# Patient Record
Sex: Female | Born: 1945 | Race: White | Hispanic: No | Smoking: Never smoker
Health system: Southern US, Community
[De-identification: ages and names within clinical notes are randomized; demographics above are authoritative.]

## PROBLEM LIST (undated history)

## (undated) DIAGNOSIS — K921 Melena: Secondary | ICD-10-CM

## (undated) DIAGNOSIS — I10 Essential (primary) hypertension: Secondary | ICD-10-CM

## (undated) DIAGNOSIS — R87615 Unsatisfactory cytologic smear of cervix: Secondary | ICD-10-CM

## (undated) DIAGNOSIS — R195 Other fecal abnormalities: Secondary | ICD-10-CM

## (undated) HISTORY — DX: Melena: K92.1

## (undated) HISTORY — DX: Other fecal abnormalities: R19.5

## (undated) HISTORY — PX: TUBAL LIGATION: SHX77

## (undated) HISTORY — PX: EYE SURGERY: SHX253

## (undated) HISTORY — PX: ABDOMINAL HYSTERECTOMY: SHX81

## (undated) HISTORY — DX: Essential (primary) hypertension: I10

## (undated) HISTORY — PX: COSMETIC SURGERY: SHX468

## (undated) HISTORY — PX: HERNIA REPAIR: SHX51

## (undated) HISTORY — DX: Unsatisfactory cytologic smear of cervix: R87.615

---

## 2005-10-11 ENCOUNTER — Ambulatory Visit: Payer: Self-pay | Admitting: Internal Medicine

## 2010-11-01 ENCOUNTER — Encounter: Payer: Self-pay | Admitting: Gastroenterology

## 2010-11-08 ENCOUNTER — Encounter (INDEPENDENT_AMBULATORY_CARE_PROVIDER_SITE_OTHER): Payer: Self-pay | Admitting: *Deleted

## 2010-12-27 NOTE — Letter (Signed)
Summary: New Patient letter  [facility]  [address]   [place], [place] [address]   Phone: [phone]  Fax: [phone]       11/08/2010 MRN: [id number]  [facility] [address] [place], [place]  [address]  Dear Ms. Mcneal,  Welcome to the Gastroenterology Division at [facility].    You are scheduled to see Dr.  Jarold Motto on 12/18/2010 at 8:30 am on the 3rd floor at [facility], [address]  We ask that you try to arrive at our office 15 minutes prior to your appointment time to allow for check-in.  We would like you to complete the enclosed self-administered evaluation form prior to your visit and bring it with you on the day of your appointment.  We will review it with you.  Also, please bring a complete list of all your medications or, if you prefer, bring the medication bottles and we will list them.  Please bring your insurance card so that we may make a copy of it.  If your insurance requires a referral to see a specialist, please bring your referral form from your primary care physician.  Co-payments are due at the time of your visit and may be paid by cash, check or credit card.     Your office visit will consist of a consult with your physician (includes a physical exam), any laboratory testing he/she may order, scheduling of any necessary diagnostic testing (e.g. x-ray, ultrasound, CT-scan), and scheduling of a procedure (e.g. Endoscopy, Colonoscopy) if required.  Please allow enough time on your schedule to allow for any/all of these possibilities.    If you cannot keep your appointment, please call [phone] to cancel or reschedule prior to your appointment date.  This allows [place] the opportunity to schedule an appointment for another patient in need of care.  If you do not cancel or reschedule by 5 p.m. the business day prior to your appointment date, you will be charged a $50.00 late cancellation/no-show fee.    Thank you  for choosing [facility] for your medical needs.  We appreciate the opportunity to care for you.  Please visit [place] at our website  to learn more about our practice.                     Sincerely,                                                             The Gastroenterology Division

## 2010-12-31 DIAGNOSIS — K921 Melena: Secondary | ICD-10-CM | POA: Insufficient documentation

## 2010-12-31 DIAGNOSIS — I10 Essential (primary) hypertension: Secondary | ICD-10-CM | POA: Insufficient documentation

## 2011-01-01 ENCOUNTER — Encounter (INDEPENDENT_AMBULATORY_CARE_PROVIDER_SITE_OTHER): Payer: Self-pay | Admitting: *Deleted

## 2011-01-01 ENCOUNTER — Encounter: Payer: Self-pay | Admitting: Gastroenterology

## 2011-01-01 ENCOUNTER — Other Ambulatory Visit: Payer: Self-pay | Admitting: Gastroenterology

## 2011-01-01 ENCOUNTER — Ambulatory Visit (INDEPENDENT_AMBULATORY_CARE_PROVIDER_SITE_OTHER): Payer: Medicare Other | Admitting: Gastroenterology

## 2011-01-01 ENCOUNTER — Other Ambulatory Visit: Payer: Medicare Other

## 2011-01-01 DIAGNOSIS — R87615 Unsatisfactory cytologic smear of cervix: Secondary | ICD-10-CM | POA: Insufficient documentation

## 2011-01-01 DIAGNOSIS — R195 Other fecal abnormalities: Secondary | ICD-10-CM

## 2011-01-01 DIAGNOSIS — K921 Melena: Secondary | ICD-10-CM

## 2011-01-01 LAB — CBC WITH DIFFERENTIAL/PLATELET
Basophils Relative: 0.6 % (ref 0.0–3.0)
Eosinophils Relative: 2.2 % (ref 0.0–5.0)
Hemoglobin: 13.7 g/dL (ref 12.0–15.0)
Lymphocytes Relative: 30 % (ref 12.0–46.0)
MCHC: 34.3 g/dL (ref 30.0–36.0)
Monocytes Relative: 7.6 % (ref 3.0–12.0)
Neutro Abs: 3.6 10*3/uL (ref 1.4–7.7)
RBC: 4.35 Mil/uL (ref 3.87–5.11)
WBC: 6 10*3/uL (ref 4.5–10.5)

## 2011-01-01 LAB — IBC PANEL
Iron: 107 ug/dL (ref 42–145)
Saturation Ratios: 27.9 % (ref 20.0–50.0)
Transferrin: 274.3 mg/dL (ref 212.0–360.0)

## 2011-01-01 LAB — FOLATE: Folate: 20.1 ng/mL (ref >5.9)

## 2011-01-07 ENCOUNTER — Other Ambulatory Visit: Payer: Self-pay | Admitting: Gastroenterology

## 2011-01-07 ENCOUNTER — Other Ambulatory Visit (AMBULATORY_SURGERY_CENTER): Payer: Medicare Other | Admitting: Gastroenterology

## 2011-01-07 DIAGNOSIS — K921 Melena: Secondary | ICD-10-CM

## 2011-01-07 DIAGNOSIS — K649 Unspecified hemorrhoids: Secondary | ICD-10-CM

## 2011-01-07 DIAGNOSIS — D126 Benign neoplasm of colon, unspecified: Secondary | ICD-10-CM

## 2011-01-07 DIAGNOSIS — R195 Other fecal abnormalities: Secondary | ICD-10-CM

## 2011-01-10 NOTE — Letter (Signed)
Summary: [facility] Instructions  [facility]  [address] [place], [place] [address]   Phone: [phone]  Fax: [phone]       Carmen Fernandez    12-03-1945    MRN: [id number]        Procedure Day /Date: Monday 01/07/2011     Arrival Time: 10:30am     Procedure Time: 11:30am     Location of Procedure:                    X  [facility] (4th Floor)                       PREPARATION FOR COLONOSCOPY WITH MOVIPREP   Starting 5 days prior to your procedure  01/02/2011 do not eat nuts, seeds, popcorn, corn, beans, peas,  salads, or any raw vegetables.  Do not take any fiber supplements (e.g. Metamucil, Citrucel, and Benefiber).  THE DAY BEFORE YOUR PROCEDURE         Sunday 01/06/2011  1.  Drink clear liquids the entire day-NO SOLID FOOD  2.  Do not drink anything colored red or purple.  Avoid juices with pulp.  No orange juice.  3.  Drink at least 64 oz. (8 glasses) of fluid/clear liquids during the day to prevent dehydration and help the prep work efficiently.  CLEAR LIQUIDS INCLUDE: Water Jello Ice Popsicles Tea (sugar ok, no milk/cream) Powdered fruit flavored drinks Coffee (sugar ok, no milk/cream) Gatorade Juice: apple, white grape, white cranberry  Lemonade Clear bullion, consomm, broth Carbonated beverages (any kind) Strained chicken noodle soup Hard Candy                             4.  In the morning, mix first dose of MoviPrep solution:    Empty 1 Pouch A and 1 Pouch B into the disposable container    Add lukewarm drinking water to the top line of the container. Mix to dissolve    Refrigerate (mixed solution should be used within 24 hrs)  5.  Begin drinking the prep at 5:00 p.m. The MoviPrep container is divided by 4 marks.   Every 15 minutes drink the solution down to the next mark (approximately 8 oz) until the full liter is complete.   6.  Follow completed prep with 16 oz of clear liquid of your choice (Nothing red or  purple).  Continue to drink clear liquids until bedtime.  7.  Before going to bed, mix second dose of MoviPrep solution:    Empty 1 Pouch A and 1 Pouch B into the disposable container    Add lukewarm drinking water to the top line of the container. Mix to dissolve    Refrigerate  THE DAY OF YOUR PROCEDURE      Monday 01/07/2011  Beginning at 6:30am (5 hours before procedure):         1. Every 15 minutes, drink the solution down to the next mark (approx 8 oz) until the full liter is complete.  2. Follow completed prep with 16 oz. of clear liquid of your choice.    3. You may drink clear liquids until 9:30am (2 HOURS BEFORE PROCEDURE).   MEDICATION INSTRUCTIONS  Unless otherwise instructed, you should take regular prescription medications with a small sip of water   as early as possible the morning of your procedure.  OTHER INSTRUCTIONS  You will need a responsible adult at least 66 years of age to accompany you and drive you home.   This person must remain in the waiting room during your procedure.  Wear loose fitting clothing that is easily removed.  Leave jewelry and other valuables at home.  However, you may wish to bring a book to read or  an iPod/MP3 player to listen to music as you wait for your procedure to start.  Remove all body piercing jewelry and leave at home.  Total time from sign-in until discharge is approximately 2-3 hours.  You should go home directly after your procedure and rest.  You can resume normal activities the  day after your procedure.  The day of your procedure you should not:   Drive   Make legal decisions   Operate machinery   Drink alcohol   Return to work  You will receive specific instructions about eating, activities and medications before you leave.    The above instructions have been reviewed and explained to me by   _______________________    I fully understand and can verbalize these instructions  _____________________________ Date _________

## 2011-01-10 NOTE — Assessment & Plan Note (Signed)
Summary: BLOOD IN STOOL...JJ   SCH'D W/PT//MEDICARE//BCBS//NO GI HX//M.Marland KitchenMarland Kitchen   History of Present Illness Visit Type: Initial Consult Primary GI MD: Sheryn Bison MD FACP FAGA Primary Provider: Robert Bellow, MD Requesting Provider: Robert Bellow, MD Chief Complaint: Blood in stool x 1 month History of Present Illness:   Extremely Pleasant 65 year old Caucasian female referred through the courtesy of Dr. Thayer Ohm Guest per recent guaiac positive stool at the time of her physical exam.The Patient denies any GI complaints except for chronic constipation alleviated with recent increased  fiber in her diet. She specifically denies melena, hematochezia, abdominal pain, rectal pain, any upper gastrointestinal or hepatobiliary complaints. She has not had previous barium studies or endoscopic or colonoscopy exams. There is no history of NSAID or salicylate use, anorexia, weight loss, or other systemic complaints. Recent Pap smear did show an abnormal Pap smear, and she is referred to Dr. Arlyce Dice for gynecologic exam.Family History is entirely negative except for her father who was a heavy smoker and died of lung cancer.   GI Review of Systems      Denies abdominal pain, acid reflux, belching, bloating, chest pain, dysphagia with liquids, dysphagia with solids, heartburn, loss of appetite, nausea, vomiting, vomiting blood, weight loss, and  weight gain.      Reports heme positive stool and  hemorrhoids.     Denies anal fissure, black tarry stools, change in bowel habit, constipation, diarrhea, diverticulosis, fecal incontinence, irritable bowel syndrome, jaundice, light color stool, liver problems, rectal bleeding, and  rectal pain.  Preventive Screening-Counseling & Management  Alcohol-Tobacco     Smoking Status: never      Drug Use:  no.      Current Medications (verified): 1)  None  Allergies (verified): 1)  ! Pcn  Past History:  Past medical, surgical, family and social histories (including  risk factors) reviewed for relevance to current acute and chronic problems.  Past Medical History: Reviewed history from 12/31/2010 and no changes required. Current Problems:  HYPERTENSION (ICD-401.9) HEMOCCULT POSITIVE STOOL (ICD-578.1)    Past Surgical History: Reviewed history from 12/31/2010 and no changes required. Left Inguinal Hernia Repair Hysterectomy  Family History: Reviewed history from 12/31/2010 and no changes required. Family History of Heart Disease: Mother  Social History: Reviewed history from 12/31/2010 and no changes required. Illicit Drug Use - no Occupation:  Patient has never smoked.  Alcohol Use - yes Daily Caffeine Use Smoking Status:  never  Review of Systems  The patient denies allergy/sinus, anemia, anxiety-new, arthritis/joint pain, back pain, blood in urine, breast changes/lumps, change in vision, confusion, cough, coughing up blood, depression-new, fainting, fatigue, fever, headaches-new, hearing problems, heart murmur, heart rhythm changes, itching, menstrual pain, muscle pains/cramps, night sweats, nosebleeds, pregnancy symptoms, shortness of breath, skin rash, sleeping problems, sore throat, swelling of feet/legs, swollen lymph glands, thirst - excessive , urination - excessive , urination changes/pain, urine leakage, vision changes, and voice change.    Vital Signs:  Patient profile:   65 year old female Height:      66 inches Weight:      145.13 pounds BMI:     23.51 Pulse rate:   60 / minute Pulse rhythm:   regular BP sitting:   122 / 80  (left arm) Cuff size:   regular  Vitals Entered By: June McMurray CMA Duncan Dull) (January 01, 2011 9:39 AM)  Physical Exam  General:  Well developed, well nourished, no acute distress.healthy appearing.   Head:  Normocephalic and atraumatic. Eyes:  PERRLA,  no icterus.exam deferred to patient's ophthalmologist.   Neck:  Supple; no masses or thyromegaly. Lungs:  Clear throughout to  auscultation. Heart:  Regular rate and rhythm; no murmurs, rubs,  or bruits. Abdomen:  Soft, nontender and nondistended. No masses, hepatosplenomegaly or hernias noted. Normal bowel sounds. Rectal:  deferred until time of colonoscopy.   Msk:  Symmetrical with no gross deformities. Normal posture. Pulses:  Normal pulses noted. Extremities:  No clubbing, cyanosis, edema or deformities noted. Neurologic:  Alert and  oriented x4;  grossly normal neurologically. Cervical Nodes:  No significant cervical adenopathy. Psych:  Alert and cooperative. Normal mood and affect.   Impression & Recommendations:  Problem # 1:  FECAL OCCULT BLOOD (ICD-792.1) Assessment Unchanged Colonoscopy scheduled at her convenience to exclude underlying colonic polyposis. I did order repeat CBC and iron studies. The preparation, risks, and benefits of colonoscopy were explained in detail, and she has agreed to proceed as planned. Her chronic constipation is improved with increased fiber in her diet. Orders: Colonoscopy (Colon) TLB-CBC Platelet - w/Differential (85025-CBCD) TLB-B12, Serum-Total ONLY (60454-U98) TLB-Ferritin (82728-FER) TLB-Folic Acid (Folate) (82746-FOL) TLB-IBC Pnl (Iron/FE;Transferrin) (83550-IBC)  Problem # 2:  HYPERTENSION (ICD-401.9) Assessment: Unchanged blood pressure today 122/80 and she is not on any medications.  Problem # 3:  NONSPEC ABNORM PAP CERV UNSATISFACTORY CYTOLOGY (ICD-795.08) Assessment: Unchanged Gynecologic referral in progress.  Patient Instructions: 1)  Copy sent to : Robert Bellow, MD 2)  Your prescription(s) have been sent to you pharmacy.  3)  Please go to the basement today for your labs.  4)  Your procedure has been scheduled for 01/07/2011, please follow the seperate instructions.  5)  [facility] Patient Information Guide given to patient.  6)  Colonoscopy and Flexible Sigmoidoscopy brochure given.  7)  The medication list was reviewed and reconciled.   All changed / newly prescribed medications were explained.  A complete medication list was provided to the patient / caregiver. Prescriptions: MOVIPREP 100 GM  SOLR (PEG-KCL-NACL-NASULF-NA ASC-C) As per prep instructions.  #1 x 0   Entered by:   Harlow Mares CMA (AAMA)   Authorized by:   Mardella Layman MD [facility]   Signed by:   Mardella Layman MD [facility] on 01/01/2011   Method used:   Electronically to        [facility] [address] [phone]* (retail)       [address]       [place], [place]  [address]       Ph: [phone] or [phone]       Fax: [phone]   RxID:   [id number]

## 2011-01-11 ENCOUNTER — Encounter: Payer: Self-pay | Admitting: Gastroenterology

## 2011-01-16 NOTE — Procedures (Addendum)
Summary: Colonoscopy  Patient: Carmen Fernandez Note: All result statuses are Final unless otherwise noted.  Tests: (1) Colonoscopy (COL)   COL Colonoscopy           DONE     [facility]     [address]     [place], [place]  [address]           COLONOSCOPY PROCEDURE REPORT           PATIENT:  Carmen, Fernandez  MR#:  [id number]     BIRTHDATE:  10/14/1946, 64 yrs. old  GENDER:  female     ENDOSCOPIST:  Vania Rea. Jarold Motto, MD, [facility]     REF. BY:  Robert Bellow, M.D.     PROCEDURE DATE:  01/07/2011     PROCEDURE:  Colonoscopy with snare polypectomy     ASA CLASS:  Class I     INDICATIONS:  FOBT positive stool     MEDICATIONS:   Fentanyl 87.5 IV, Versed 8 mg IV           DESCRIPTION OF PROCEDURE:   After the risks benefits and     alternatives of the procedure were thoroughly explained, informed     consent was obtained.  Digital rectal exam was performed and     revealed no abnormalities.   The LB [id number]-[id number] endoscope     was introduced through the anus and advanced to the cecum, which     was identified by both the appendix and ileocecal valve, without     limitations.  The quality of the prep was excellent, using     MoviPrep.  The instrument was then slowly withdrawn as the colon     was fully examined.     <<PROCEDUREIMAGES>>           FINDINGS:  ENDOSCOPIC FINDINGS:  A sessile polyp was found in the     cecum. CM. CECAL POLYP IN BASE OF CECUM HOT SNARE EXCISED.     Retroflexed views in the rectum revealed hemorrhoids.    The scope     was then withdrawn from the patient and the procedure completed.           COMPLICATIONS:  None     ENDOSCOPIC IMPRESSION:     1) Sessile polyp in the cecum     2) Hemorrhoids     R/O ADENOMA.     RECOMMENDATIONS:     1) Repeat colonoscopy in 5 years.     REPEAT EXAM:  No           ______________________________     Vania Rea. Jarold Motto, MD, [facility]           CC:           n.     eSIGNED:   Vania Rea. Patterson at 01/07/2011  11:37 AM           Darden Palmer, [id number]  Note: An exclamation mark (!) indicates a result that was not dispersed into the flowsheet. Document Creation Date: 01/07/2011 11:37 AM _______________________________________________________________________  (1) Order result status: Final Collection or observation date-time: 01/07/2011 11:27 Requested date-time:  Receipt date-time:  Reported date-time:  Referring Physician:   Ordering Physician: Sheryn Bison [phone]) Specimen Source:  Source: Launa Grill Order Number: [id number] Lab site:   Appended Document: Colonoscopy     Procedures Next Due Date:    Colonoscopy: 12/2015

## 2011-01-22 NOTE — Letter (Signed)
Summary: Patient Notice- Polyp Results  [facility]  [address] [place], [place] [address]   Phone: [phone]  Fax: [phone]        January 11, 2011 MRN: [id number]    [facility] [address] [place], [place]  [address]    Dear Ms. Wallman,  I am pleased to inform you that the colon polyp(s) removed during your recent colonoscopy was (were) found to be benign (no cancer detected) upon pathologic examination.  I recommend you have a repeat colonoscopy examination in 5_ years to look for recurrent polyps, as having colon polyps increases your risk for having recurrent polyps or even colon cancer in the future.  Should you develop new or worsening symptoms of abdominal pain, bowel habit changes or bleeding from the rectum or bowels, please schedule an evaluation with either your primary care physician or with me.  Additional information/recommendations:  _x_ No further action with gastroenterology is needed at this time. Please      follow-up with your primary care physician for your other healthcare      needs.  __ Please call [phone] to schedule a return visit to review your      situation.  __ Please keep your follow-up visit as already scheduled.  __ Continue treatment plan as outlined the day of your exam.  Please call us if you are having persistent problems or have questions about your condition that have not been fully answered at this time.  Sincerely,  Mardella Layman MD [facility]  This letter has been electronically signed by your physician.  Appended Document: Patient Notice- Polyp Results letter mailed

## 2012-04-08 ENCOUNTER — Ambulatory Visit (INDEPENDENT_AMBULATORY_CARE_PROVIDER_SITE_OTHER): Payer: Medicare Other | Admitting: Family Medicine

## 2012-04-08 VITALS — BP 142/83 | HR 64 | Temp 98.4°F | Resp 18 | Ht 65.25 in | Wt 152.0 lb

## 2012-04-08 DIAGNOSIS — I10 Essential (primary) hypertension: Secondary | ICD-10-CM

## 2012-04-08 DIAGNOSIS — Z0000 Encounter for general adult medical examination without abnormal findings: Secondary | ICD-10-CM

## 2012-04-08 DIAGNOSIS — R5383 Other fatigue: Secondary | ICD-10-CM

## 2012-04-08 LAB — COMPREHENSIVE METABOLIC PANEL
ALT: 28 U/L (ref 0–35)
AST: 20 U/L (ref 0–37)
Albumin: 4.7 g/dL (ref 3.5–5.2)
Calcium: 9.5 mg/dL (ref 8.4–10.5)
Chloride: 104 mEq/L (ref 96–112)
Creat: 0.75 mg/dL (ref 0.50–1.10)
Potassium: 4.8 mEq/L (ref 3.5–5.3)
Total Bilirubin: 0.4 mg/dL (ref 0.3–1.2)

## 2012-04-08 LAB — POCT CBC
Granulocyte percent: 60.9 %G (ref 37–80)
MCH, POC: 29.9 pg (ref 27–31.2)
MCHC: 32.6 g/dL (ref 31.8–35.4)
MID (cbc): 0.5 (ref 0–0.9)
MPV: 8.7 fL (ref 0–99.8)
POC MID %: 6.9 %M (ref 0–12)
Platelet Count, POC: 369 10*3/uL (ref 142–424)
RBC: 4.51 M/uL (ref 4.04–5.48)
WBC: 6.7 10*3/uL (ref 4.6–10.2)

## 2012-04-08 LAB — LIPID PANEL
HDL: 54 mg/dL (ref >39)
LDL Cholesterol: 166 mg/dL — ABNORMAL HIGH (ref 0–99)
Total CHOL/HDL Ratio: 4.7 Ratio

## 2012-04-08 NOTE — Progress Notes (Signed)
  Patient Name: Carmen Fernandez Date of Birth: 1945/12/05 Medical Record Number: [id number] Gender: female Date of Encounter: 04/08/2012  History of Present Illness:  Carmen Fernandez is a 66 y.o. very pleasant female patient who presents with the following:  Here today for a CPE.  She does have an OBG who does her annual breast exam and pap- both are UTD Mammogram UTD, Pap UTD, colonoscopy UTD, pneumovax and zostavax done  Last tetanus shot last year.   Not 100% fasting this am but has not had much to eat.    See Pink sheet for further details.    Patient Active Problem List  Diagnoses  . HYPERTENSION  . HEMOCCULT POSITIVE STOOL  . FECAL OCCULT BLOOD  . NONSPEC ABNORM PAP CERV UNSATISFACTORY CYTOLOGY   No past medical history on file. No past surgical history on file. History  Substance Use Topics  . Smoking status: Never Smoker   . Smokeless tobacco: Not on file  . Alcohol Use: Not on file   No family history on file. Allergies  Allergen Reactions  . Penicillins     REACTION: as a child    Medication list has been reviewed and updated.  Review of Systems: As per HPI- otherwise negative.   Physical Examination: Filed Vitals:   04/08/12 1004  BP: 142/83  Pulse: 64  Temp: 98.4 F (36.9 C)  TempSrc: Oral  Resp: 18  Height: 5' 5.25" (1.657 m)  Weight: 152 lb (68.947 kg)  115/ 80 BP recheck  Body mass index is 25.10 kg/(m^2).  GEN: WDWN, NAD, Non-toxic, A & O x 3 HEENT: Atraumatic, Normocephalic. Neck supple. No masses, No LAD. Ears and Nose: No external deformity. CV: RRR, No M/G/R. No JVD. No thrill. No extra heart sounds. PULM: CTA B, no wheezes, crackles, rhonchi. No retractions. No resp. distress. No accessory muscle use. ABD: S, NT, ND, +BS. No rebound. No HSM. EXTR: No c/c/e NEURO Normal gait.  PSYCH: Normally interactive. Conversant. Not depressed or anxious appearing.  Calm demeanor.   Results for orders placed in visit on 04/08/12  POCT CBC     Component Value Range   WBC 6.7  4.6 - 10.2 (K/uL)   Lymph, poc 2.2  0.6 - 3.4    POC LYMPH PERCENT 32.2  10 - 50 (%L)   MID (cbc) 0.5  0 - 0.9    POC MID % 6.9  0 - 12 (%M)   POC Granulocyte 4.1  2 - 6.9    Granulocyte percent 60.9  37 - 80 (%G)   RBC 4.51  4.04 - 5.48 (M/uL)   Hemoglobin 13.5  12.2 - 16.2 (g/dL)   HCT, POC 40.9  81.1 - 47.9 (%)   MCV 91.7  80 - 97 (fL)   MCH, POC 29.9  27 - 31.2 (pg)   MCHC 32.6  31.8 - 35.4 (g/dL)   RDW, POC 91.4     Platelet Count, POC 369  142 - 424 (K/uL)   MPV 8.7  0 - 99.8 (fL)    Assessment and Plan: 1. Physical exam, annual  POCT CBC, Comprehensive metabolic panel, Lipid panel  2. Fatigue  TSH   Healthy female.  She has noted a bit of fatigue so check TSH.  Otherwise await labs as above.

## 2012-04-09 ENCOUNTER — Encounter: Payer: Self-pay | Admitting: Family Medicine

## 2012-04-28 ENCOUNTER — Telehealth: Payer: Self-pay

## 2012-04-28 NOTE — Telephone Encounter (Signed)
Printed out labs from epic and printed out phone message due to patient wanting last years labs as well.

## 2012-04-28 NOTE — Telephone Encounter (Signed)
Please mail this years labs along with copy of last years.  Apt E

## 2012-05-01 ENCOUNTER — Telehealth: Payer: Self-pay

## 2012-05-01 NOTE — Telephone Encounter (Signed)
See lab letter, [facility] for pt to CB.

## 2012-05-01 NOTE — Telephone Encounter (Signed)
.  umfc The patient called requesting results of labs done 04/08/12.  Please call the patient at [phone].

## 2012-05-04 NOTE — Telephone Encounter (Signed)
Pt had not received lab letter we sent. Went over results and instr's w/pt who agreed to f/up. Mailed pt another copy of letter.

## 2012-05-21 ENCOUNTER — Other Ambulatory Visit: Payer: Self-pay | Admitting: Obstetrics and Gynecology

## 2012-07-13 ENCOUNTER — Ambulatory Visit (INDEPENDENT_AMBULATORY_CARE_PROVIDER_SITE_OTHER): Payer: Medicare Other | Admitting: Family Medicine

## 2012-07-13 VITALS — BP 160/83 | HR 66 | Temp 98.3°F | Resp 18 | Ht 66.0 in | Wt 154.0 lb

## 2012-07-13 DIAGNOSIS — B029 Zoster without complications: Secondary | ICD-10-CM

## 2012-07-13 DIAGNOSIS — R030 Elevated blood-pressure reading, without diagnosis of hypertension: Secondary | ICD-10-CM

## 2012-07-13 MED ORDER — VALACYCLOVIR HCL 1 G PO TABS: 1000.0000 mg | ORAL_TABLET | Freq: Three times a day (TID) | ORAL | Status: DC

## 2012-07-13 MED ORDER — HYDROCODONE-ACETAMINOPHEN 5-325 MG PO TABS: 1.0000 | ORAL_TABLET | Freq: Four times a day (QID) | ORAL | Status: AC | PRN

## 2012-07-13 NOTE — Progress Notes (Signed)
  Subjective:    Patient ID: Carmen Fernandez, female    DOB: 1946-10-16, 66 y.o.   MRN: [id number]  HPI Carmen Fernandez is a 66 y.o. female Noticed rash under L arm, and upper back 4 days ago. 1st noticed fatigue, tingling/numbness in upper back and arm area prior.    Tx: neosporin. No relief.     Review of Systems  Constitutional: Negative for fever and chills.  Skin: Positive for rash.       Objective:   Physical Exam  Constitutional: She is oriented to person, place, and time. She appears well-developed and well-nourished. No distress.  HENT:  Head: Normocephalic and atraumatic.  Cardiovascular: Normal rate, regular rhythm, normal heart sounds and intact distal pulses.   Pulmonary/Chest: Effort normal and breath sounds normal.  Musculoskeletal: Normal range of motion.  Lymphadenopathy:    She has no cervical adenopathy.  Neurological: She is alert and oriented to person, place, and time.  Skin: Skin is warm and dry. Rash noted.          Erythematous papulo-vesicular rash.    Psychiatric: She has a normal mood and affect. Her behavior is normal.          Assessment & Plan:  Carmen Fernandez is a 67 y.o. female 1. Herpes zoster  valACYclovir (VALTREX) 1000 MG tablet, HYDROcodone-acetaminophen (NORCO/VICODIN) 5-325 MG per tablet  2. Elevated blood pressure     Valtrex as above.  rtc precautions.   Check outside bp's and rtc if remain elevated.  Possible pain component.

## 2012-07-13 NOTE — Patient Instructions (Signed)
Shingles Shingles is caused by the same virus that causes chickenpox (varicella zoster virus or VZV). Shingles often occurs many years or decades after having chickenpox. That is why it is more common in adults older than 50 years. The virus reactivates and breaks out as an infection in a nerve root. SYMPTOMS   The initial feeling (sensations) may be pain. This pain is usually described as:   Burning.   Stabbing.   Throbbing.   Tingling in the nerve root.   A red rash will follow in a couple days. The rash may occur in any area of the body and is usually on one side (unilateral) of the body in a band or belt-like pattern. The rash usually starts out as very small blisters (vesicles). They will dry up after 7 to 10 days. This is not usually a significant problem except for the pain it causes.   Long-lasting (chronic) pain is more likely in an elderly person. It can last months to years. This condition is called postherpetic neuralgia.  Shingles can be an extremely severe infection in someone with AIDS, a weakened immune system, or with forms of leukemia. It can also be severe if you are taking transplant medicines or other medicines that weaken the immune system. TREATMENT  Your caregiver will often treat you with:  Antiviral drugs.   Anti-inflammatory drugs.   Pain medicines.  Bed rest is very important in preventing the pain associated with herpes zoster (postherpetic neuralgia). Application of heat in the form of a hot water bottle or electric heating pad or gentle pressure with the hand is recommended to help with the pain or discomfort. PREVENTION  A varicella zoster vaccine is available to help protect against the virus. The Food and Drug Administration approved the varicella zoster vaccine for individuals 18 years of age and older. HOME CARE INSTRUCTIONS   Cool compresses to the area of rash may be helpful.   Only take over-the-counter or prescription medicines for pain,  discomfort, or fever as directed by your caregiver.   Avoid contact with:   Babies.   Pregnant women.   Children with eczema.   Elderly people with transplants.   People with chronic illnesses, such as leukemia and AIDS.   If the area involved is on your face, you may receive a referral for follow-up to a specialist. It is very important to keep all follow-up appointments. This will help avoid eye complications, chronic pain, or disability.  SEEK IMMEDIATE MEDICAL CARE IF:   You develop any pain (headache) in the area of the face or eye. This must be followed carefully by your caregiver or ophthalmologist. An infection in part of your eye (cornea) can be very serious. It could lead to blindness.   You do not have pain relief from prescribed medicines.   Your redness or swelling spreads.   The area involved becomes very swollen and painful.   You have a fever.   You notice any red or painful lines extending away from the affected area toward your heart (lymphangitis).   Your condition is worsening or has changed.  Document Released: 11/11/2005 Document Revised: 10/31/2011 Document Reviewed: 10/16/2009 [facility] Patient Information 2012 [place], [place].  Return to the clinic or go to the nearest emergency room if any of your symptoms worsen or new symptoms occur.

## 2012-09-25 ENCOUNTER — Ambulatory Visit (INDEPENDENT_AMBULATORY_CARE_PROVIDER_SITE_OTHER): Payer: Medicare Other | Admitting: Family Medicine

## 2012-09-25 ENCOUNTER — Other Ambulatory Visit: Payer: Self-pay | Admitting: Emergency Medicine

## 2012-09-25 ENCOUNTER — Ambulatory Visit
Admission: RE | Admit: 2012-09-25 | Discharge: 2012-09-25 | Disposition: A | Discharge status: Home / Self Care | Payer: Medicare Other | Source: Ambulatory Visit | Attending: Family Medicine | Admitting: Family Medicine

## 2012-09-25 ENCOUNTER — Telehealth: Payer: Self-pay | Admitting: Radiology

## 2012-09-25 VITALS — BP 144/90 | HR 68 | Temp 98.1°F | Resp 18 | Ht 66.75 in | Wt 152.8 lb

## 2012-09-25 DIAGNOSIS — R030 Elevated blood-pressure reading, without diagnosis of hypertension: Secondary | ICD-10-CM

## 2012-09-25 DIAGNOSIS — R55 Syncope and collapse: Secondary | ICD-10-CM

## 2012-09-25 DIAGNOSIS — R002 Palpitations: Secondary | ICD-10-CM

## 2012-09-25 DIAGNOSIS — R51 Headache: Secondary | ICD-10-CM

## 2012-09-25 DIAGNOSIS — R000 Tachycardia, unspecified: Secondary | ICD-10-CM

## 2012-09-25 LAB — POCT CBC
HCT, POC: 44.5 % (ref 37.7–47.9)
Hemoglobin: 13.6 g/dL (ref 12.2–16.2)
MCH, POC: 29.3 pg (ref 27–31.2)
MCV: 95.8 fL (ref 80–97)
MID (cbc): 0.5 (ref 0–0.9)
RBC: 4.64 M/uL (ref 4.04–5.48)
WBC: 6.1 10*3/uL (ref 4.6–10.2)

## 2012-09-25 LAB — BASIC METABOLIC PANEL
BUN: 14 mg/dL (ref 6–23)
CO2: 28 mEq/L (ref 19–32)
Chloride: 103 mEq/L (ref 96–112)
Glucose, Bld: 93 mg/dL (ref 70–99)
Potassium: 4.7 mEq/L (ref 3.5–5.3)

## 2012-09-25 NOTE — Telephone Encounter (Signed)
Marti, CT tech called with report of CT head, negative, the exam is in system.

## 2012-09-25 NOTE — Progress Notes (Signed)
Subjective:    Patient ID: Carmen Fernandez Section, female    DOB: 02/10/1946, 66 y.o.   MRN: [id number]  Hypertension Associated symptoms include chest pain (L chest wall sensation then L collarbone as above. no arm radiation, no current chest pain.) and palpitations. Pertinent negatives include no neck pain.   Carmen Fernandez is a 66 y.o. female  Headache few days ago - abrupt severe headache, felt like going to pass out, vision went dark for a second. Heart was racing. Most severe headache ever had. Not usually having headaches.  No chest pain.  Heart racing lasted about 15 min.pressure in head/headache last all night. No blurry vision, no slurred speech, no face or extremity weakness. No recent change in stressors.  Improved next day. Has had occasional headache past few days - comes and goes, but not as severe as initial headache. No known FH of aneurysm.   Palpitations - feels like heart faster and beating harder.  Episodic heart racing at times for past few years, lasts few minutes - heart beats harder, then resolves on own - infrequent.   Hx of shingles - left side of chest wall on 07/13/12.  Treated with valtrex.  Since then - occasional awareness in area, sensation , not pain.  Occasional catch in chest - feels like in heart - feels like string tied around heart. Last few minutes.  Pain into L collarbone from 10pm to 4 am, no arm pain, no chest pain then.  FH: mom with stents in 80's. No early CAD, CVD in family.   Nonsmoker, no supplements. Etoh - few drinks per week.    Review of Systems  Constitutional: Negative for fever, chills and fatigue.  HENT: Negative for hearing loss, trouble swallowing, neck pain and neck stiffness.   Respiratory: Negative for chest tightness.   Cardiovascular: Positive for chest pain (L chest wall sensation then L collarbone as above. no arm radiation, no current chest pain.) and palpitations. Negative for leg swelling.  Gastrointestinal: Negative for nausea  and vomiting.  Skin: Positive for rash (few bumps in area of shingles at times.  no other rash. ).       Objective:   Physical Exam  Vitals reviewed. Constitutional: She is oriented to person, place, and time. She appears well-developed and well-nourished. No distress.  HENT:  Head: Normocephalic and atraumatic.  Right Ear: External ear normal.  Left Ear: External ear normal.  Mouth/Throat: Oropharynx is clear and moist.  Eyes: Conjunctivae normal and EOM are normal. Pupils are equal, round, and reactive to light.  Neck: Carotid bruit is not present.  Cardiovascular: Normal rate, regular rhythm, normal heart sounds and intact distal pulses.   Pulmonary/Chest: Effort normal and breath sounds normal.  Abdominal: Soft. She exhibits no pulsatile midline mass. There is no tenderness.  Neurological: She is alert and oriented to person, place, and time. She has normal strength. No cranial nerve deficit or sensory deficit. She displays a negative Romberg sign. Coordination and gait normal.       No pronator drift, normal RAM's. Normal finger to toe.   Skin: Skin is warm and dry. She is not diaphoretic.  Psychiatric: She has a normal mood and affect. Her behavior is normal.   Results for orders placed in visit on 09/25/12  POCT CBC      Component Value Range   WBC 6.1  4.6 - 10.2 K/uL   Lymph, poc 2.0  0.6 - 3.4   POC LYMPH PERCENT 33.2  10 - 50 %L   MID (cbc) 0.5  0 - 0.9   POC MID % 7.8  0 - 12 %M   POC Granulocyte 3.6  2 - 6.9   Granulocyte percent 59.0  37 - 80 %G   RBC 4.64  4.04 - 5.48 M/uL   Hemoglobin 13.6  12.2 - 16.2 g/dL   HCT, POC 16.1  09.6 - 47.9 %   MCV 95.8  80 - 97 fL   MCH, POC 29.3  27 - 31.2 pg   MCHC 30.6 (*) 31.8 - 35.4 g/dL   RDW, POC 04.5     Platelet Count, POC 368  142 - 424 K/uL   MPV 9.1  0 - 99.8 fL   EKG: nsr - rate 60, no acute findings.     Assessment & Plan:  CHIE GREENLIEF is a 66 y.o. female 1. Headache  POCT CBC, TSH, Basic metabolic panel,  CT Head Wo Contrast  2. Elevated blood pressure  POCT CBC, TSH, Basic metabolic panel  3. Tachycardia  EKG 12-Lead  4. Syncope, near     HA - check head CT today, but with worst headache - may need further eval/imaging with ddx including aneurysm. Improved currently and nonfocal exam.   Palpitations - check BMP, tsh.  May need cardiology follow up. Recheck to discuss in next 1 week - sooner if worse. Considering low dose betablocker with slightly elevated BP - recheck outside bp's.   L collarbone pain - resolved curretnly.  rtc or ER if chest pain returns, likely to eval at cardiology for palpitations and occasional "catch" sensation in chest.  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   Rtc/er/911 precautions discussed.    Patient Instructions  Your should receive a call or letter about your lab results within the next week to 10 days, but follow up in the next week to discuss a possible eval by cardiology for these symptoms. Return to the clinic or go to the nearest emergency room if any of your symptoms worsen or new symptoms occur. Keep a record of your blood pressures outside of the office and bring them to the next office visit. We will determine further workup for your headaches after the CT scan results.

## 2012-09-25 NOTE — Patient Instructions (Signed)
Your should receive a call or letter about your lab results within the next week to 10 days, but follow up in the next week to discuss a possible eval by cardiology for these symptoms. Return to the clinic or go to the nearest emergency room if any of your symptoms worsen or new symptoms occur. Keep a record of your blood pressures outside of the office and bring them to the next office visit. We will determine further workup for your headaches after the CT scan results.

## 2012-10-02 ENCOUNTER — Ambulatory Visit (INDEPENDENT_AMBULATORY_CARE_PROVIDER_SITE_OTHER): Payer: Medicare Other | Admitting: Emergency Medicine

## 2012-10-02 VITALS — BP 126/74 | HR 65 | Temp 97.5°F | Resp 16 | Ht 65.5 in | Wt 152.0 lb

## 2012-10-02 DIAGNOSIS — R079 Chest pain, unspecified: Secondary | ICD-10-CM

## 2012-10-02 DIAGNOSIS — G459 Transient cerebral ischemic attack, unspecified: Secondary | ICD-10-CM

## 2012-10-02 NOTE — Progress Notes (Signed)
[facility] [address] [place] [address] [phone]  Date:  10/02/2012   Name:  Carmen Fernandez   DOB:  01-02-1946   MRN:  [id number]  PCP:  Tally Due, MD    Chief Complaint: Follow-up   History of Present Illness:  Carmen Fernandez is a 66 y.o. very pleasant female patient who presents with the following:  Seen with elevated blood pressure and headache.   Describes event leading up to her visit as a near syncope that occurred while she was cooking.  She walked out of the kitchen and her vision got black and she had a sensation of rapid heart beat that lasted around 30 minutes.  Had some discomfort in chest but no diaphoresis or shortness of breath.  Attributed the pain to indigestion and took tums.  Developed a severe headache.  She came to the office four days later.  Has had no recurrence of symptoms.  Brings to office a blood pressure list that shows only three borderline elevations.    Patient Active Problem List  Diagnosis  . HYPERTENSION  . HEMOCCULT POSITIVE STOOL  . FECAL OCCULT BLOOD  . NONSPEC ABNORM PAP CERV UNSATISFACTORY CYTOLOGY    No past medical history on file.  Past Surgical History  Procedure Date  . Cosmetic surgery   . Eye surgery   . Abdominal hysterectomy   . Tubal ligation     History  Substance Use Topics  . Smoking status: Never Smoker   . Smokeless tobacco: Not on file  . Alcohol Use: Not on file    Family History  Problem Relation Age of Onset  . Hypertension Mother   . Hypertension Sister     Allergies  Allergen Reactions  . Penicillins     REACTION: as a child    Medication list has been reviewed and updated.  Current Outpatient Prescriptions on File Prior to Visit  Medication Sig Dispense Refill  . valACYclovir (VALTREX) 1000 MG tablet Take 1 tablet (1,000 mg total) by mouth 3 (three) times daily.  21 tablet  0    Review of Systems:  As per HPI, otherwise negative.    Physical  Examination: Filed Vitals:   10/02/12 1014  BP: 126/74  Pulse: 65  Temp: 97.5 F (36.4 C)  Resp: 16   Filed Vitals:   10/02/12 1014  Height: 5' 5.5" (1.664 m)  Weight: 152 lb (68.947 kg)   Body mass index is 24.91 kg/(m^2). Ideal Body Weight: Weight in (lb) to have BMI = 25: 152.2   GEN: WDWN, NAD, Non-toxic, A & O x 3 HEENT: Atraumatic, Normocephalic. Neck supple. No masses, No LAD. Ears and Nose: No external deformity. CV: RRR, No M/G/R. No JVD. No thrill. No extra heart sounds. PULM: CTA B, no wheezes, crackles, rhonchi. No retractions. No resp. distress. No accessory muscle use. ABD: S, NT, ND, +BS. No rebound. No HSM. EXTR: No c/c/e NEURO Normal gait.  PSYCH: Normally interactive. Conversant. Not depressed or anxious appearing.  Calm demeanor.    Assessment and Plan: ASA daily Cardiology consult Stress test Follow up after consultation.  Carmelina Dane, MD

## 2012-10-08 ENCOUNTER — Ambulatory Visit
Admission: RE | Admit: 2012-10-08 | Discharge: 2012-10-08 | Disposition: A | Discharge status: Home / Self Care | Payer: Medicare Other | Source: Ambulatory Visit | Attending: Emergency Medicine | Admitting: Emergency Medicine

## 2012-10-08 ENCOUNTER — Other Ambulatory Visit: Payer: Self-pay | Admitting: Emergency Medicine

## 2012-10-08 DIAGNOSIS — G459 Transient cerebral ischemic attack, unspecified: Secondary | ICD-10-CM

## 2012-10-08 DIAGNOSIS — E079 Disorder of thyroid, unspecified: Secondary | ICD-10-CM

## 2012-10-10 NOTE — Progress Notes (Deleted)
  Subjective:    Patient ID: Carmen Fernandez, female    DOB: 04-Nov-1946, 66 y.o.   MRN: [id number]  HPI    Review of Systems     Objective:   Physical Exam        Assessment & Plan:

## 2012-10-10 NOTE — Progress Notes (Signed)
Reviewed and agree.

## 2012-10-14 ENCOUNTER — Ambulatory Visit
Admission: RE | Admit: 2012-10-14 | Discharge: 2012-10-14 | Disposition: A | Discharge status: Home / Self Care | Payer: Medicare Other | Source: Ambulatory Visit | Attending: Emergency Medicine | Admitting: Emergency Medicine

## 2012-10-14 ENCOUNTER — Other Ambulatory Visit (HOSPITAL_COMMUNITY)
Admission: RE | Admit: 2012-10-14 | Discharge: 2012-10-14 | Disposition: A | Discharge status: Home / Self Care | Payer: Medicare Other | Source: Ambulatory Visit | Attending: Interventional Radiology | Admitting: Interventional Radiology

## 2012-10-14 DIAGNOSIS — E049 Nontoxic goiter, unspecified: Secondary | ICD-10-CM | POA: Insufficient documentation

## 2012-10-14 DIAGNOSIS — E079 Disorder of thyroid, unspecified: Secondary | ICD-10-CM

## 2012-10-26 ENCOUNTER — Encounter: Payer: Self-pay | Admitting: *Deleted

## 2012-10-26 ENCOUNTER — Encounter: Payer: Self-pay | Admitting: Cardiovascular Disease

## 2012-10-27 ENCOUNTER — Ambulatory Visit (INDEPENDENT_AMBULATORY_CARE_PROVIDER_SITE_OTHER): Payer: Medicare Other | Admitting: Cardiovascular Disease

## 2012-10-27 ENCOUNTER — Encounter: Payer: Self-pay | Admitting: Cardiovascular Disease

## 2012-10-27 VITALS — BP 125/95 | HR 76 | Ht 66.0 in | Wt 155.0 lb

## 2012-10-27 DIAGNOSIS — E049 Nontoxic goiter, unspecified: Secondary | ICD-10-CM

## 2012-10-27 DIAGNOSIS — I10 Essential (primary) hypertension: Secondary | ICD-10-CM

## 2012-10-27 DIAGNOSIS — R079 Chest pain, unspecified: Secondary | ICD-10-CM

## 2012-10-27 NOTE — Assessment & Plan Note (Signed)
F/U primary TSH ok and fine needle biopsy non neoplastic

## 2012-10-27 NOTE — Progress Notes (Signed)
Patient ID: Carmen Fernandez, female   DOB: August 05, 1946, 66 y.o.   MRN: [id number] 66 yo referred for ? Chest pain.  She has had zoster in T4 dermatome and this caused a lot of angst.  Palpitatoins with pressure in chest.  This has resolved.  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  She walks daily and currently only mild dyspnea.  Palpitations resolved.  Had normal carotids in November.  One episode of "things backing out"  No focal neuro signs and CT normal.    ROS: Denies fever, malais, weight loss, blurry vision, decreased visual acuity, cough, sputum, SOB, hemoptysis, pleuritic pain, palpitaitons, heartburn, abdominal pain, melena, lower extremity edema, claudication, or rash.  All other systems reviewed and negative   General: Affect appropriate Healthy:  appears stated age HEENT: normal Neck supple with no adenopathy JVP normal no bruits no thyromegaly Lungs clear with no wheezing and good diaphragmatic motion Heart:  S1/S2 no murmur,rub, gallop or click PMI normal Abdomen: benighn, BS positve, no tenderness, no AAA no bruit.  No HSM or HJR Distal pulses intact with no bruits No edema Neuro non-focal Skin warm and dry No muscular weakness  Medications No current outpatient prescriptions on file.    Allergies Penicillins  Family History: Family History  Problem Relation Age of Onset  . Hypertension Mother   . Hypertension Sister     Social History: History   Social History  . Marital Status: Single    Spouse Name: N/A    Number of Children: N/A  . Years of Education: N/A   Occupational History  . Not on file.   Social History Main Topics  . Smoking status: Never Smoker   . Smokeless tobacco: Not on file  . Alcohol Use: Not on file  . Drug Use: Not on file  . Sexually Active: Not on file   Other Topics Concern  . Not on file   Social History Narrative  . No narrative on file     Electrocardiogram:  09/25/12  NSR normal ECG Assessment and Plan

## 2012-10-27 NOTE — Assessment & Plan Note (Signed)
Atypical related to zoster.  F/U ETT Normal ECG and exam

## 2012-10-27 NOTE — Assessment & Plan Note (Signed)
Well controlled.  Continue current medications and low sodium Dash type diet.    

## 2012-10-27 NOTE — Patient Instructions (Signed)
Your physician recommends that you schedule a follow-up appointment in: AS NEEDED  Your physician has requested that you have an exercise tolerance test. For further information please visit [email or web address] Please also follow instruction sheet, as given. DX CHEST PAIN

## 2012-11-23 ENCOUNTER — Encounter: Payer: Medicare Other | Admitting: Physician Assistant

## 2013-02-16 ENCOUNTER — Ambulatory Visit (INDEPENDENT_AMBULATORY_CARE_PROVIDER_SITE_OTHER): Payer: Medicare Other | Admitting: Emergency Medicine

## 2013-02-16 VITALS — BP 140/80 | HR 72 | Temp 97.6°F | Resp 16 | Ht 65.5 in | Wt 156.0 lb

## 2013-02-16 DIAGNOSIS — S46819A Strain of other muscles, fascia and tendons at shoulder and upper arm level, unspecified arm, initial encounter: Secondary | ICD-10-CM

## 2013-02-16 DIAGNOSIS — E049 Nontoxic goiter, unspecified: Secondary | ICD-10-CM

## 2013-02-16 DIAGNOSIS — S43499A Other sprain of unspecified shoulder joint, initial encounter: Secondary | ICD-10-CM

## 2013-02-16 MED ORDER — NAPROXEN SODIUM 550 MG PO TABS: 550.0000 mg | ORAL_TABLET | Freq: Two times a day (BID) | ORAL | Status: AC

## 2013-02-16 MED ORDER — CYCLOBENZAPRINE HCL 10 MG PO TABS: 10.0000 mg | ORAL_TABLET | Freq: Three times a day (TID) | ORAL | Status: DC | PRN

## 2013-02-16 NOTE — Patient Instructions (Addendum)

## 2013-02-16 NOTE — Progress Notes (Signed)
[facility] [address], [place] [address] [phone]  Date:  02/16/2013   Name:  Carmen Fernandez   DOB:  09/06/1946   MRN:  [id number]  PCP:  Tally Due, MD    Chief Complaint: Back Pain   History of Present Illness:  Carmen Fernandez is a 67 y.o. very pleasant female patient who presents with the following:  Just returned from a six week trip to [place] and is concerned that she has pain across her shoulders and into the base of the neck.  She drove to [facility] and slept in a strange bed and the pain started within a week of arrival.  No history of injury or overuse.  No radiation of pain or neuro symptoms.  Also concerned about the biopsy reports on her thyroid aspiration.  Patient Active Problem List  Diagnosis  . HYPERTENSION  . HEMOCCULT POSITIVE STOOL  . FECAL OCCULT BLOOD  . NONSPEC ABNORM PAP CERV UNSATISFACTORY CYTOLOGY  . Chest pain  . Goiter    Past Medical History  Diagnosis Date  . HYPERTENSION   . HEMOCCULT POSITIVE STOOL   . FECAL OCCULT BLOOD   . NONSPEC ABNORM PAP CERV UNSATISFACTORY CYTOLOGY     Past Surgical History  Procedure Laterality Date  . Cosmetic surgery    . Eye surgery    . Abdominal hysterectomy    . Tubal ligation    . Hernia repair      History  Substance Use Topics  . Smoking status: Never Smoker   . Smokeless tobacco: Not on file  . Alcohol Use: Not on file    Family History  Problem Relation Age of Onset  . Hypertension Mother   . Hypertension Sister     Allergies  Allergen Reactions  . Penicillins     REACTION: as a child    Medication list has been reviewed and updated.  No current outpatient prescriptions on file prior to visit.   No current facility-administered medications on file prior to visit.    Review of Systems:  As per HPI, otherwise negative.    Physical Examination: Filed Vitals:   02/16/13 1515  BP: 140/80  Pulse: 72  Temp: 97.6 F (36.4 C)  Resp:  16   Filed Vitals:   02/16/13 1515  Height: 5' 5.5" (1.664 m)  Weight: 156 lb (70.761 kg)   Body mass index is 25.56 kg/(m^2). Ideal Body Weight: Weight in (lb) to have BMI = 25: 152.2   GEN: WDWN, NAD, Non-toxic, Alert & Oriented x 3 HEENT: Atraumatic, Normocephalic.  Ears and Nose: No external deformity. EXTR: No clubbing/cyanosis/edema NEURO: Normal gait.  PSYCH: Normally interactive. Conversant. Not depressed or anxious appearing.  Calm demeanor.  BACK:  Tender and spasm trapezius  Assessment and Plan: Trapezius strain Goiter Endocrine consult   Signed,  Phillips Odor, MD

## 2013-04-13 ENCOUNTER — Other Ambulatory Visit: Payer: Self-pay | Admitting: Endocrinology

## 2013-04-13 DIAGNOSIS — E041 Nontoxic single thyroid nodule: Secondary | ICD-10-CM

## 2013-07-15 IMAGING — US US CAROTID DUPLEX BILAT
1 series · 13 of 24 positions shown · non-contrast
Comparison: None.

CLINICAL DATA: Near-syncope

BILATERAL CAROTID DUPLEX ULTRASOUND
TECHNIQUE: Gray scale imaging, color Doppler and duplex ultrasound
was performed of bilateral carotid and vertebral arteries in the
neck.

[Series 1: us carotid duplex bilat · 0.06mm/px · 13 of 74 slices shown]
[im 1/74]
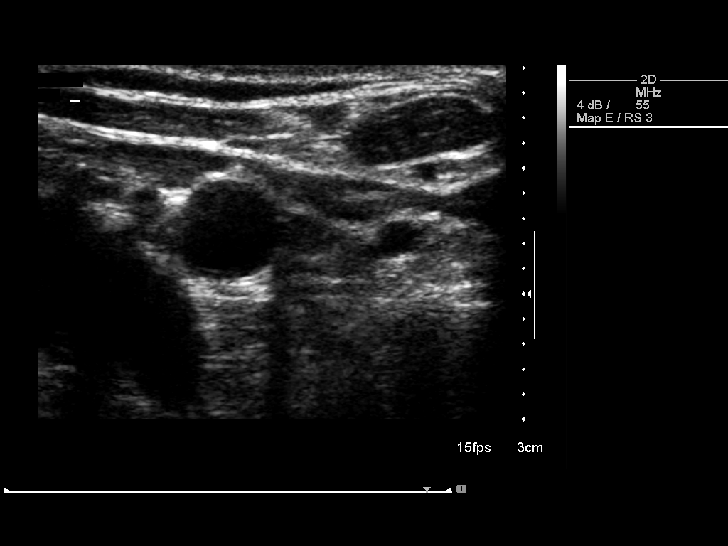
[im 7/74]
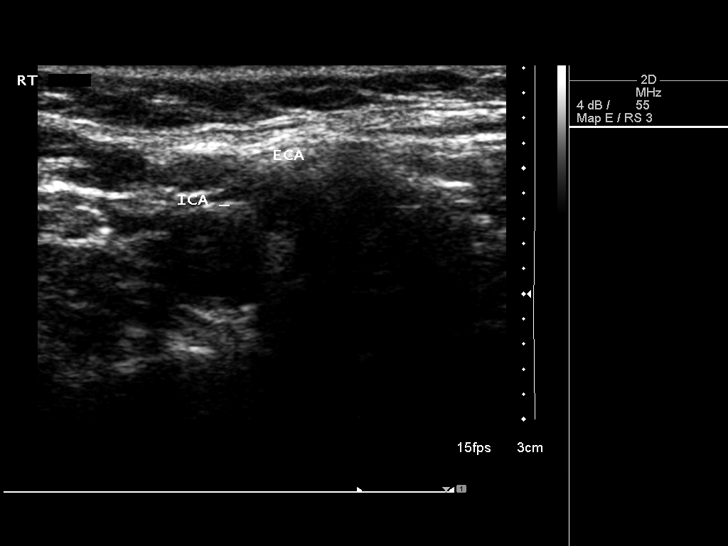
[im 13/74]
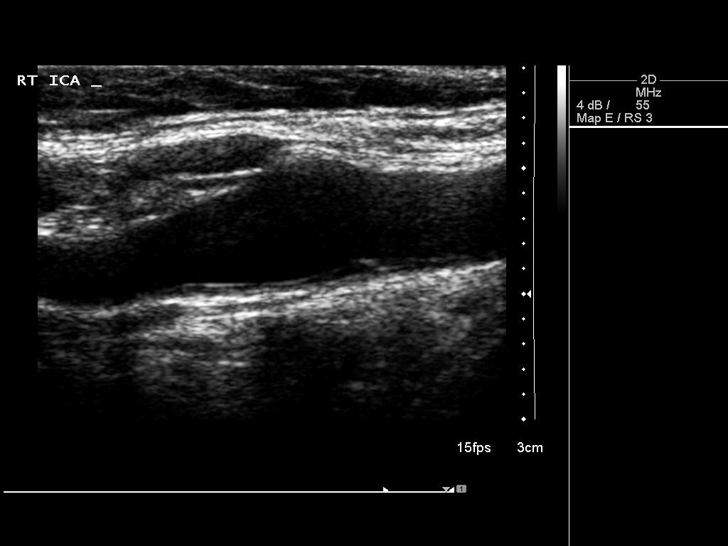
[im 20/74]
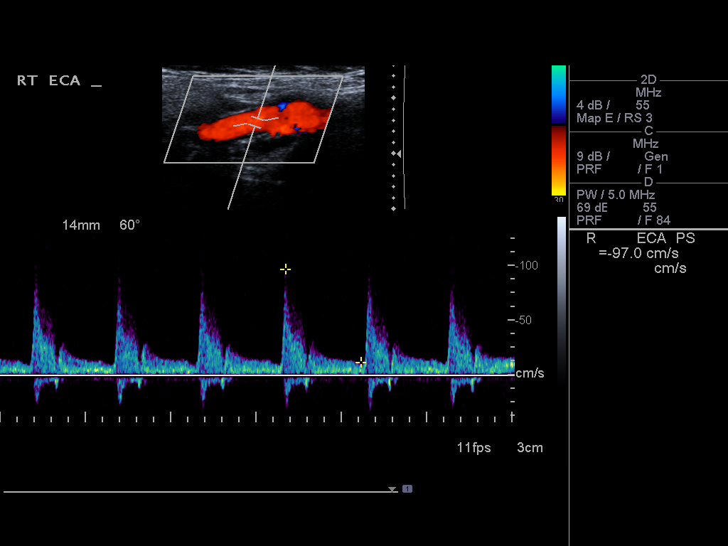
[im 26/74]
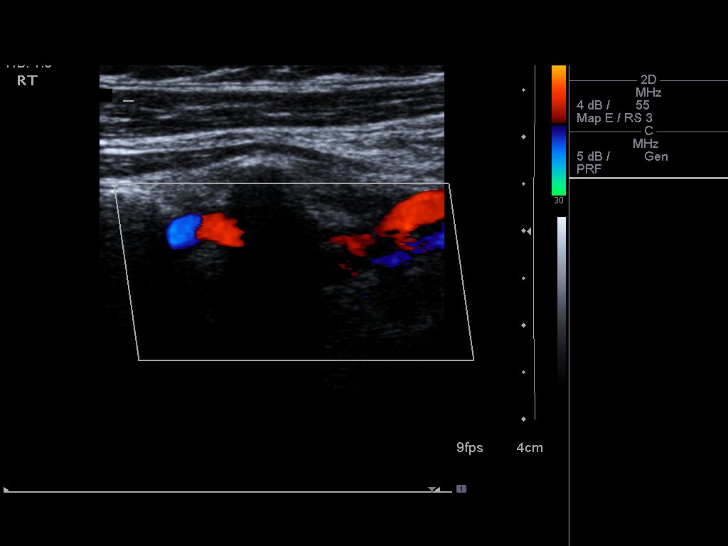
[im 32/74]
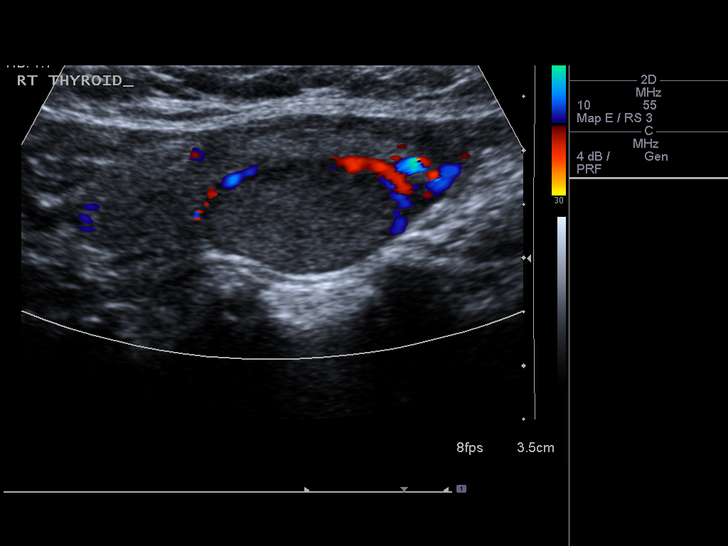
[im 39/74]
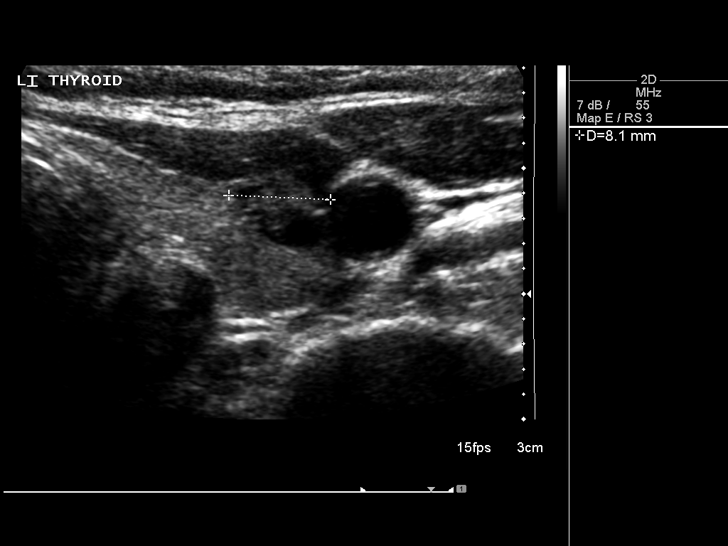
[im 42/74]
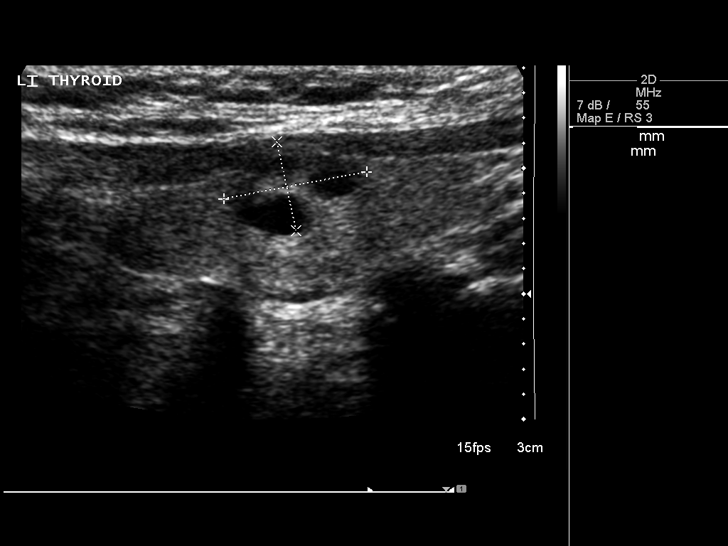
[im 48/74]
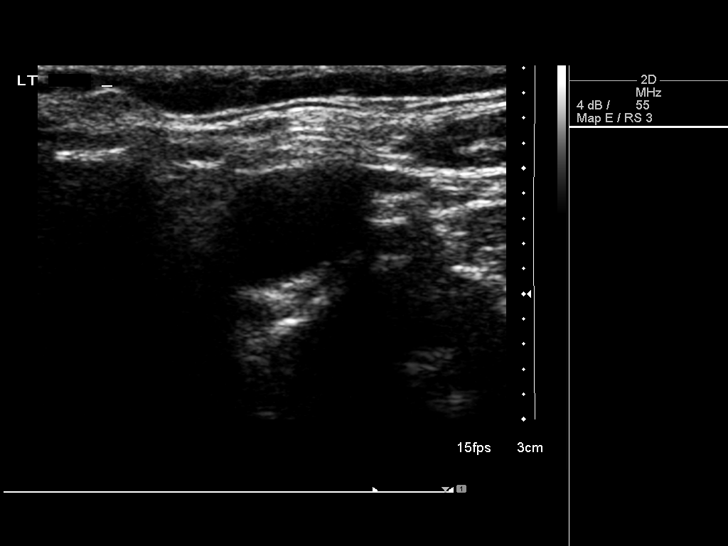
[im 54/74]
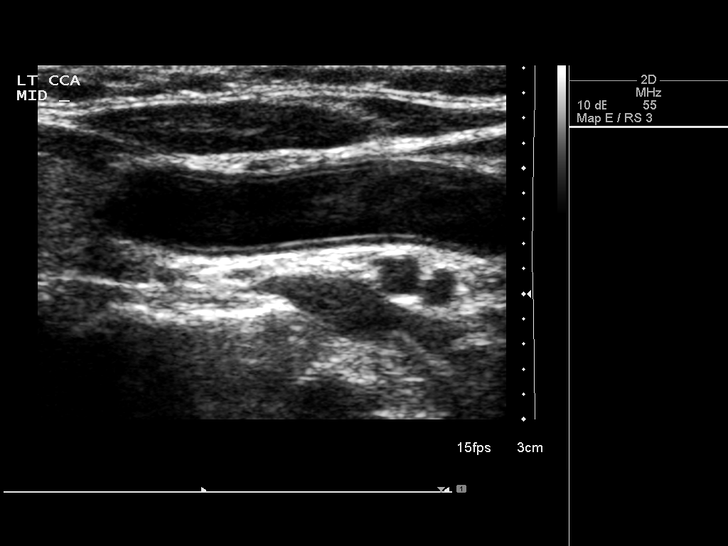
[im 61/74]
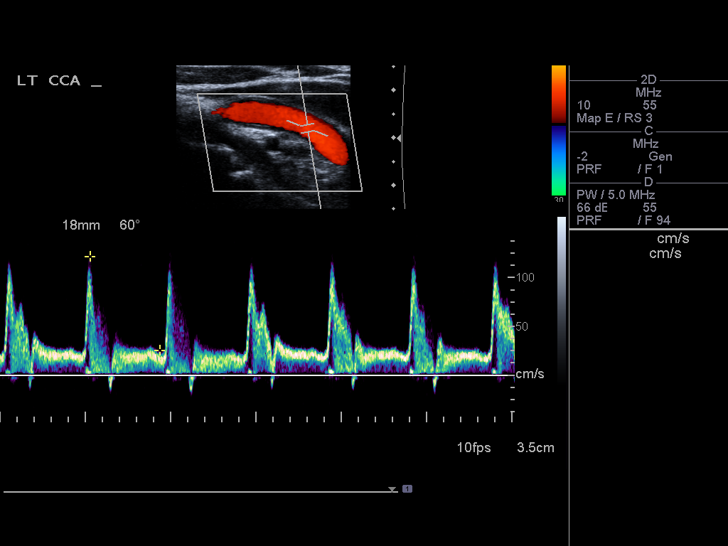
[im 67/74]
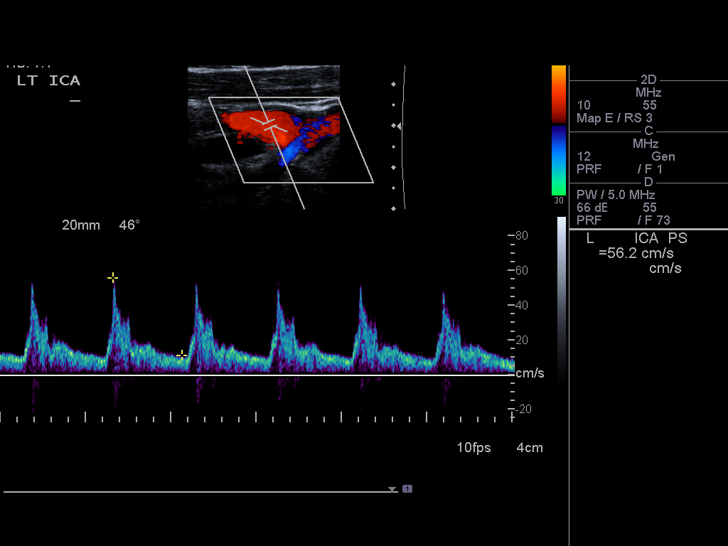
[im 74/74]
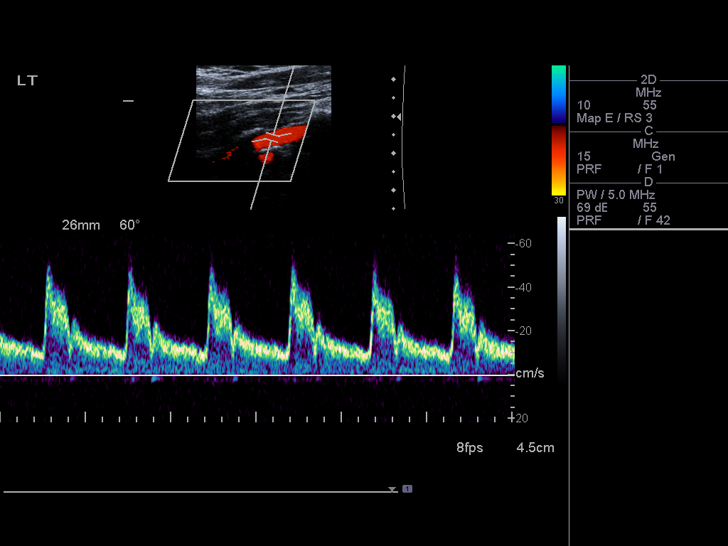

[13 of 24 positions shown; findings below may reference images not displayed]

Criteria:  Quantification of carotid stenosis is based on velocity
parameters that correlate the residual internal carotid diameter
with NASCET-based stenosis levels, using the diameter of the distal
internal carotid lumen as the denominator for stenosis measurement.

The following velocity measurements were obtained:

                 PEAK SYSTOLIC/END DIASTOLIC
RIGHT
ICA:                        98cm/sec
CCA:                        102cm/sec
SYSTOLIC ICA/CCA RATIO:
DIASTOLIC ICA/CCA RATIO:
ECA:                        97cm/sec

LEFT
ICA:                        77cm/sec
CCA:                        89cm/sec
SYSTOLIC ICA/CCA RATIO:
DIASTOLIC ICA/CCA RATIO:
ECA:                        74cm/sec
FINDINGS: Bilateral thyroid nodules are noted.  Complex 12 mm left
thyroid nodule.  Solid 2.0 cm right thyroid nodule.

RIGHT CAROTID ARTERY: Little if any plaque in the bulb.  Normal-
appearing low resistance internal carotid Doppler pattern.

RIGHT VERTEBRAL ARTERY:  Antegrade.

LEFT CAROTID ARTERY: Mild focal plaque in the proximal external
carotid.  Little if any plaque in the bulb.  Low resistance
internal carotid Doppler pattern.

LEFT VERTEBRAL ARTERY:  Antegrade.
IMPRESSION: Less than 50% stenosis in the right and left internal carotid
arteries.

There are thyroid nodules in both lobes.  The dominant nodule is
2.0 cm.  Fine needle aspiration and dedicated complete thyroid
ultrasound are recommended.

## 2013-07-21 IMAGING — US US THYROID BIOPSY
1 series · 13 of 14 positions shown · non-contrast
Comparison: Carotid duplex ultrasound 10/08/2012;

CLINICAL DATA: 66-year-old female with an incidentally detected
thyroid nodule on carotid ultrasound.  Dedicated thyroid ultrasound
imaging confirms that a dominant right thyroid nodule meets
consensus criteria for ultrasound-guided fine needle aspiration.

ULTRASOUND GUIDED NEEDLE ASPIRATE BIOPSY OF THE THYROID GLAND

[Series 1: us thyroid biopsy · 0.06mm/px · 14 acquisitions, 13 frames shown]
[im 1/14]
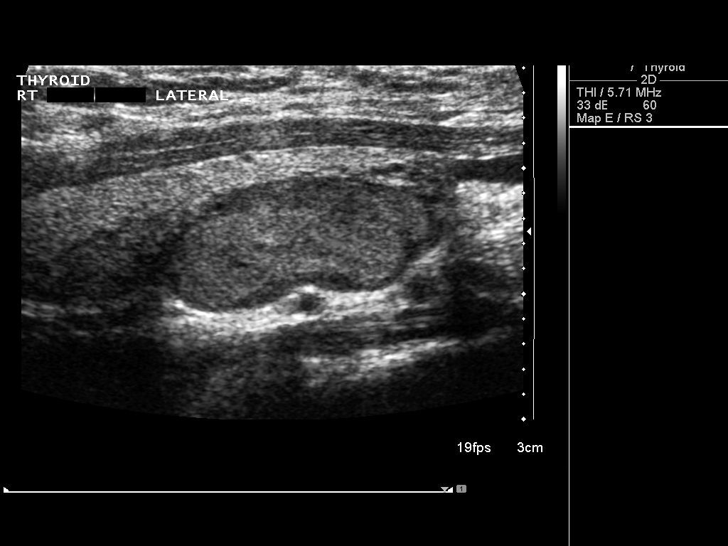
[im 2/14]
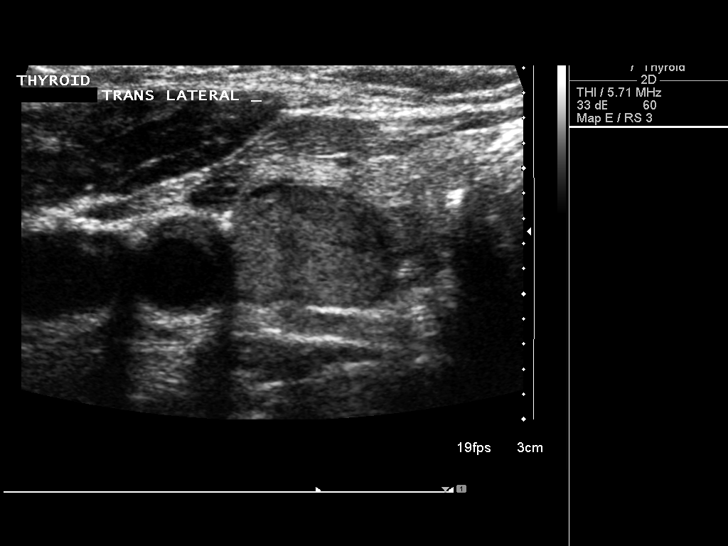
[im 3/14]
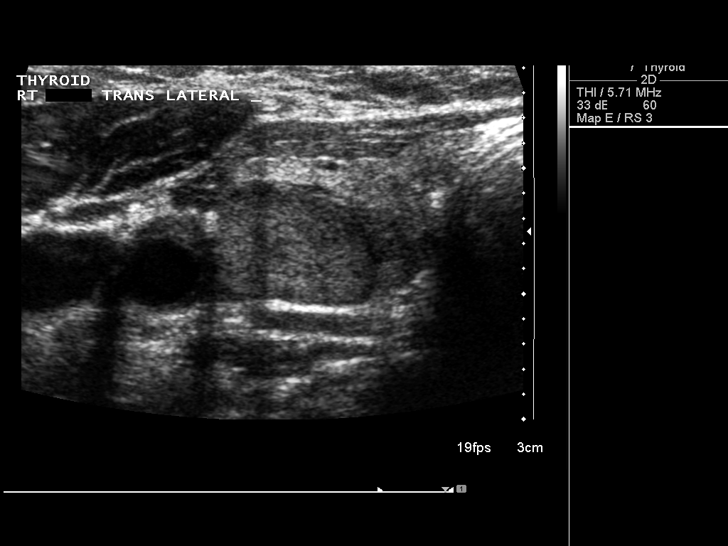
[im 4/14]
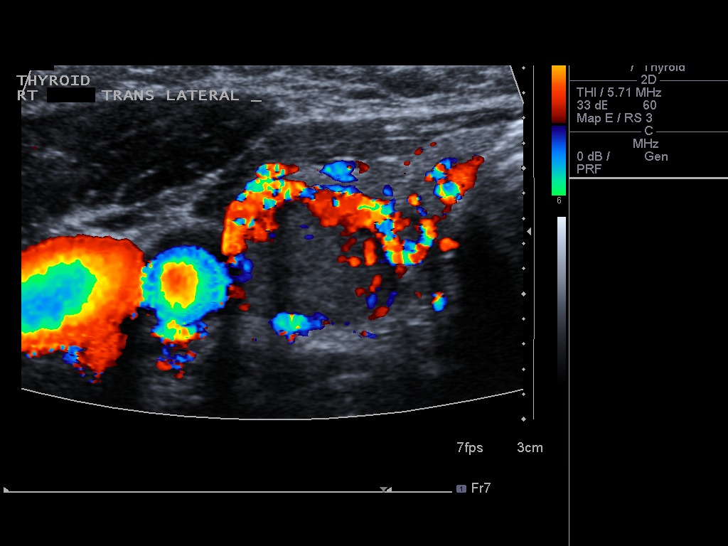
[im 5/14]
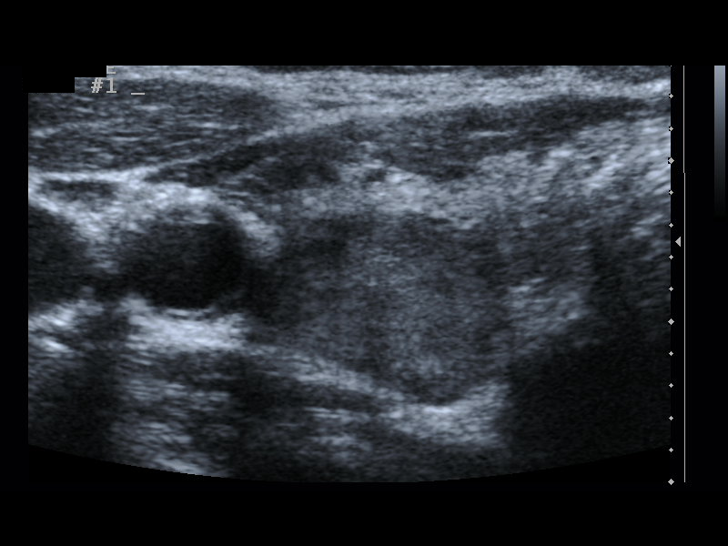
[im 6/14]
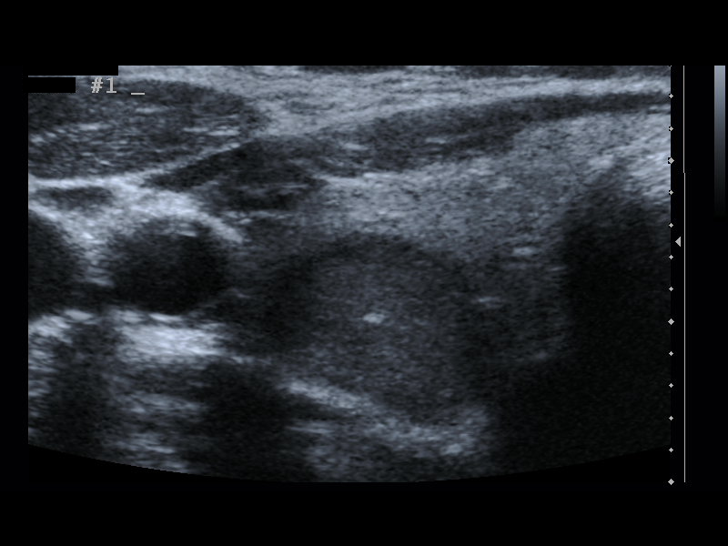
[im 8/14]
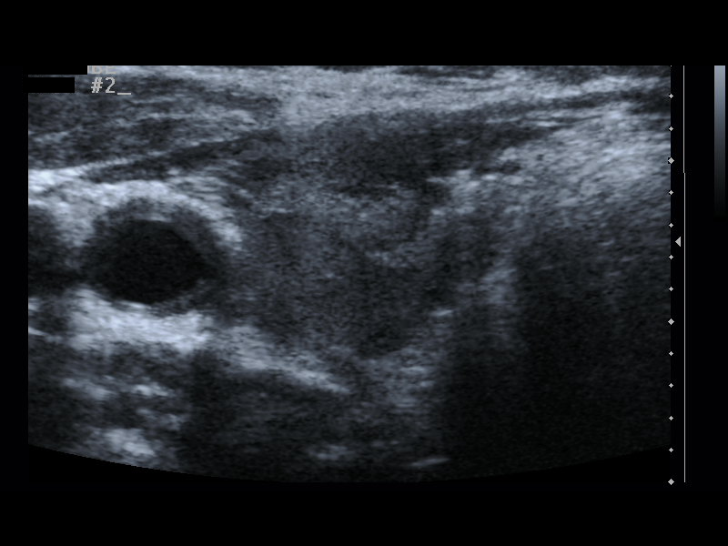
[im 9/14]
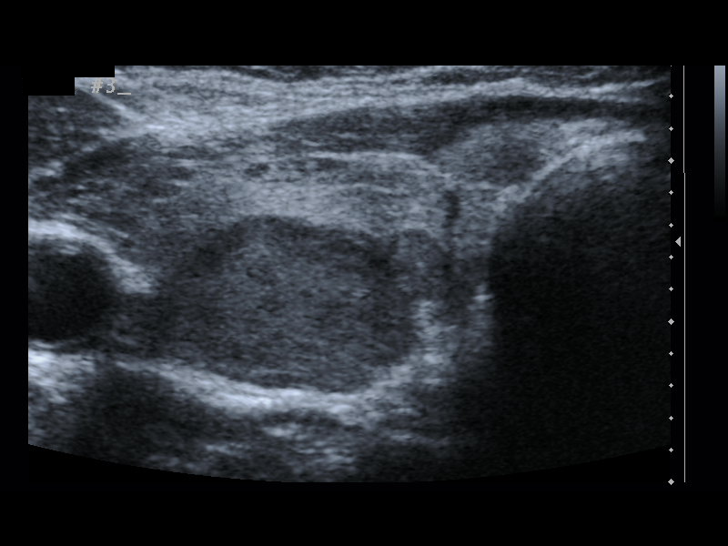
[im 10/14]
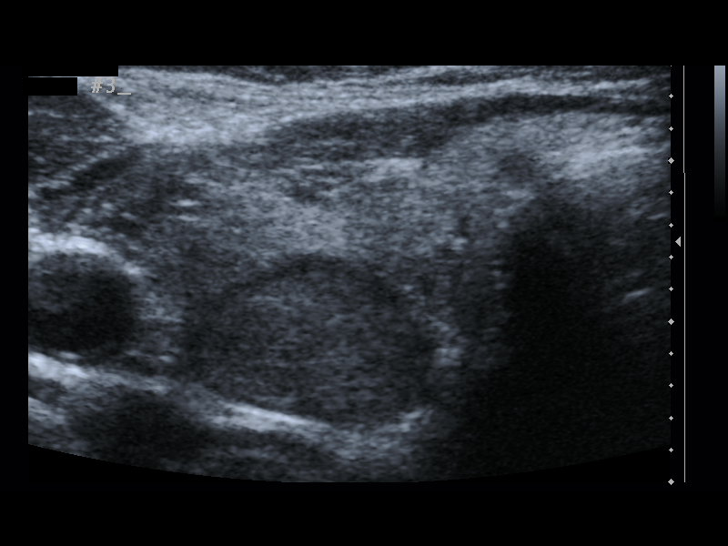
[im 11/14]
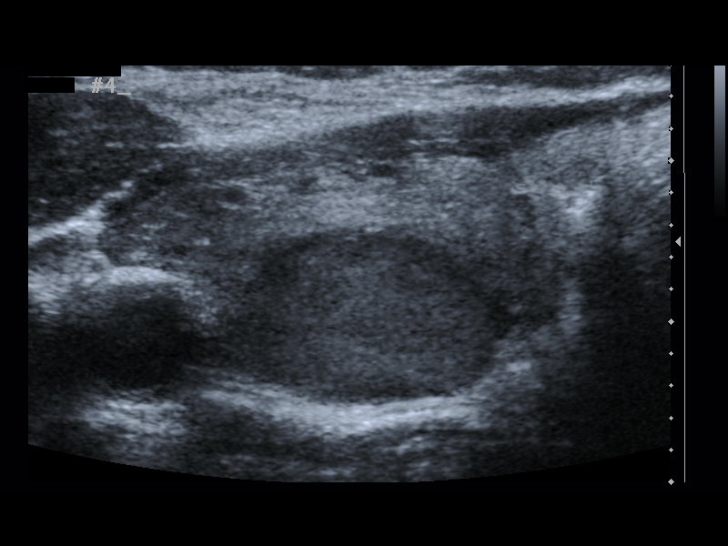
[im 12/14]
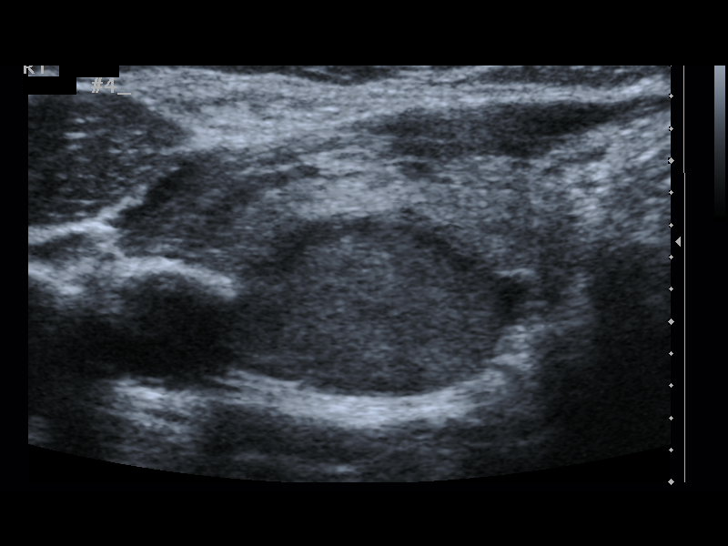
[im 13/14]
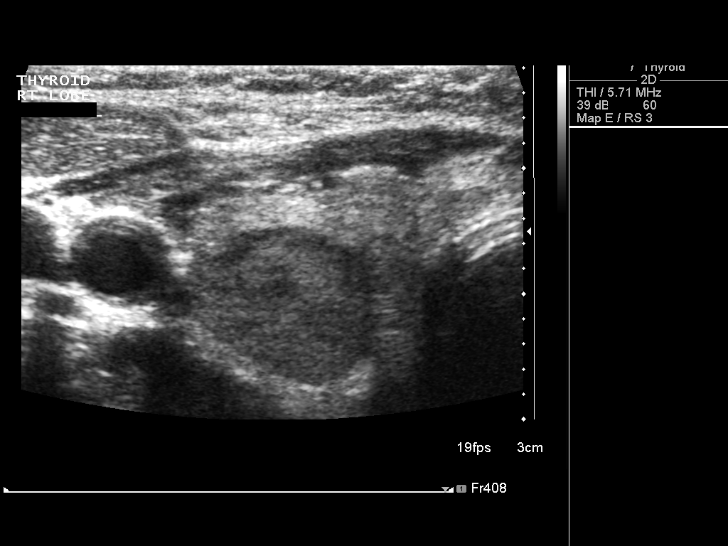
[im 14/14]
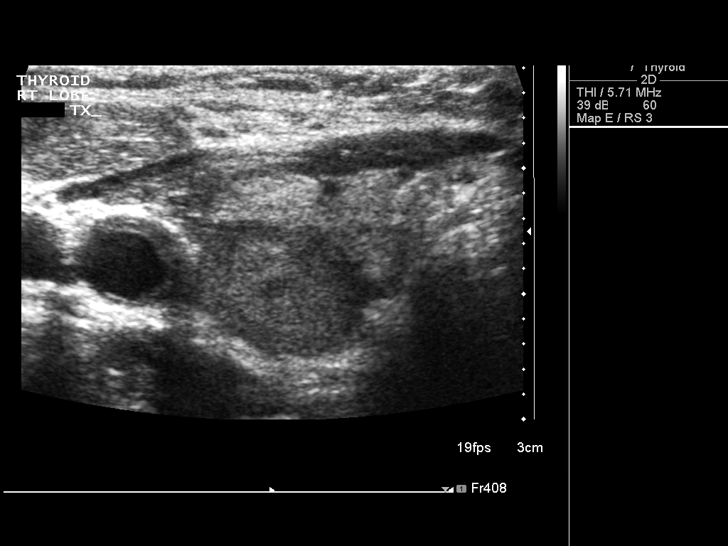

[13 of 14 positions shown; findings below may reference images not displayed]

thyroid
ultrasound earlier today 10/14/2012

Thyroid biopsy was thoroughly discussed with the patient and
questions were answered.  The benefits, risks, alternatives, and
complications were also discussed.  The patient understands and
wishes to proceed with the procedure.  Written consent was
obtained.

Ultrasound was performed to localize and mark an adequate site for
the biopsy.  The patient was then prepped and draped in a normal
sterile fashion.  Local anesthesia was provided with 1% lidocaine.
Using direct ultrasound guidance, four passes were made using 25
gauge needles into the nodule within the right lobe of the thyroid.
Ultrasound was used to confirm needle placements on all occasions.
Specimens were sent to Pathology for analysis.

Complications:  None
FINDINGS: Technically successful ultrasound guided biopsy of
dominant right lower thyroid nodule
IMPRESSION: Ultrasound guided needle aspirate biopsy performed of the right
thyroid nodule.

## 2013-10-04 ENCOUNTER — Other Ambulatory Visit: Payer: Medicare Other

## 2014-07-07 ENCOUNTER — Ambulatory Visit (INDEPENDENT_AMBULATORY_CARE_PROVIDER_SITE_OTHER): Payer: Medicare Other

## 2014-07-07 ENCOUNTER — Ambulatory Visit (INDEPENDENT_AMBULATORY_CARE_PROVIDER_SITE_OTHER): Payer: Medicare Other | Admitting: Emergency Medicine

## 2014-07-07 VITALS — BP 128/76 | HR 87 | Temp 98.2°F | Resp 16 | Ht 65.5 in | Wt 159.2 lb

## 2014-07-07 DIAGNOSIS — R062 Wheezing: Secondary | ICD-10-CM

## 2014-07-07 DIAGNOSIS — Z0000 Encounter for general adult medical examination without abnormal findings: Secondary | ICD-10-CM

## 2014-07-07 LAB — POCT CBC
Granulocyte percent: 65.3 %G (ref 37–80)
HCT, POC: 43.6 % (ref 37.7–47.9)
HEMOGLOBIN: 14.4 g/dL (ref 12.2–16.2)
LYMPH, POC: 1.8 (ref 0.6–3.4)
MCH: 29.7 pg (ref 27–31.2)
MCHC: 32.9 g/dL (ref 31.8–35.4)
MCV: 90.2 fL (ref 80–97)
MID (CBC): 0.4 (ref 0–0.9)
MPV: 7.4 fL (ref 0–99.8)
PLATELET COUNT, POC: 332 10*3/uL (ref 142–424)
POC Granulocyte: 4.1 (ref 2–6.9)
POC LYMPH PERCENT: 28.5 %L (ref 10–50)
POC MID %: 6.2 % (ref 0–12)
RBC: 4.83 M/uL (ref 4.04–5.48)
RDW, POC: 14 %
WBC: 6.3 10*3/uL (ref 4.6–10.2)

## 2014-07-07 LAB — COMPREHENSIVE METABOLIC PANEL
ALBUMIN: 4.7 g/dL (ref 3.5–5.2)
ALK PHOS: 101 U/L (ref 39–117)
ALT: 38 U/L — ABNORMAL HIGH (ref 0–35)
AST: 26 U/L (ref 0–37)
BILIRUBIN TOTAL: 0.6 mg/dL (ref 0.2–1.2)
BUN: 12 mg/dL (ref 6–23)
CO2: 28 mEq/L (ref 19–32)
CREATININE: 0.79 mg/dL (ref 0.50–1.10)
Calcium: 10.2 mg/dL (ref 8.4–10.5)
Chloride: 104 mEq/L (ref 96–112)
GLUCOSE: 101 mg/dL — AB (ref 70–99)
POTASSIUM: 4.7 meq/L (ref 3.5–5.3)
Sodium: 140 mEq/L (ref 135–145)
Total Protein: 7.5 g/dL (ref 6.0–8.3)

## 2014-07-07 LAB — LIPID PANEL
CHOL/HDL RATIO: 5.8 ratio
Cholesterol: 262 mg/dL — ABNORMAL HIGH (ref 0–200)
HDL: 45 mg/dL (ref >39)
LDL CALC: 182 mg/dL — AB (ref 0–99)
TRIGLYCERIDES: 173 mg/dL — AB (ref <150)
VLDL: 35 mg/dL (ref 0–40)

## 2014-07-07 LAB — POCT URINALYSIS DIPSTICK
Bilirubin, UA: NEGATIVE
GLUCOSE UA: NEGATIVE
Ketones, UA: NEGATIVE
NITRITE UA: NEGATIVE
PROTEIN UA: NEGATIVE
RBC UA: NEGATIVE
SPEC GRAV UA: 1.015
UROBILINOGEN UA: 0.2
pH, UA: 6.5

## 2014-07-07 LAB — POCT UA - MICROSCOPIC ONLY
BACTERIA, U MICROSCOPIC: NEGATIVE
CASTS, UR, LPF, POC: NEGATIVE
CRYSTALS, UR, HPF, POC: NEGATIVE
MUCUS UA: NEGATIVE
RBC, urine, microscopic: NEGATIVE
WBC, Ur, HPF, POC: NEGATIVE
YEAST UA: NEGATIVE

## 2014-07-07 LAB — TSH: TSH: 4.387 u[IU]/mL (ref 0.350–4.500)

## 2014-07-07 NOTE — Patient Instructions (Signed)
Mammography Mammography is an X-ray of the breasts to look for changes that are not normal. The X-ray image is called a mammogram. This procedure can screen for breast cancer, can detect cancer early, and can diagnose cancer.  LET YOUR CAREGIVER KNOW ABOUT:  Breast implants.  Previous breast disease, biopsy, or surgery.  If you are breastfeeding.  Medicines taken, including vitamins, herbs, eyedrops, over-the-counter medicines, and creams.  Use of steroids (by mouth or creams).  Possibility of pregnancy, if this applies. RISKS AND COMPLICATIONS  Exposure to radiation, but at very low levels.  The results may be misinterpreted.  The results may not be accurate.  Mammography may lead to further tests.  Mammography may not catch certain cancers. BEFORE THE PROCEDURE  Schedule your test about 7 days after your menstrual period. This is when your breasts are the least tender and have signs of hormone changes.  If you have had a mammography done at a different facility in the past, get the mammogram X-rays or have them sent to your current exam facility in order to compare them.  Wash your breasts and under your arms the day of the test.  Do not wear deodorants, perfumes, or powders anywhere on your body.  Wear clothes that you can change in and out of easily. PROCEDURE Relax as much as possible during the test. Any discomfort during the test will be very brief. The test should take less than 30 minutes. The following will happen:  You will undress from the waist up and put on a gown.  You will stand in front of the X-ray machine.  Each breast will be placed between 2 plastic or glass plates. The plates will compress your breast for a few seconds.  X-rays will be taken from different angles of the breast. AFTER THE PROCEDURE  The mammogram will be examined.  Depending on the quality of the images, you may need to repeat certain parts of the test.  Ask when your test  results will be ready. Make sure you get your test results.  You may resume normal activities. Document Released: 11/08/2000 Document Revised: 02/03/2012 Document Reviewed: 09/01/2011 [employer] Patient Information 2015 [employer]. This information is not intended to replace advice given to you by your health care provider. Make sure you discuss any questions you have with your health care provider.  

## 2014-07-07 NOTE — Progress Notes (Addendum)
[facility] [address], [place] [address] [phone]  Date:  07/07/2014   Name:  Carmen Fernandez   DOB:  Oct 28, 1946   MRN:  [id number]  PCP:  Roselee Culver, MD    Chief Complaint: Annual Exam   History of Present Illness:  Carmen Fernandez is a 68 y.o. very pleasant female patient who presents with the following:  History of goiter concerned she is gaining weight.  No meds.   Last pap in 2013.  Was normal.  Last colonoscopy 2 years ago normal Wellness exam.  Has experienced some night time wheezing.  No cough or hemoptysis.  No PND or orthopnea Denies other complaint or health concern today.   Patient Active Problem List   Diagnosis Date Noted  . Chest pain 10/27/2012  . Goiter 10/27/2012  . FECAL OCCULT BLOOD 01/01/2011  . NONSPEC ABNORM PAP CERV UNSATISFACTORY CYTOLOGY 01/01/2011  . HYPERTENSION 12/31/2010  . HEMOCCULT POSITIVE STOOL 12/31/2010    Past Medical History  Diagnosis Date  . HYPERTENSION   . HEMOCCULT POSITIVE STOOL   . FECAL OCCULT BLOOD   . NONSPEC ABNORM PAP CERV UNSATISFACTORY CYTOLOGY     Past Surgical History  Procedure Laterality Date  . Cosmetic surgery    . Eye surgery    . Abdominal hysterectomy    . Tubal ligation    . Hernia repair      History  Substance Use Topics  . Smoking status: Never Smoker   . Smokeless tobacco: Not on file  . Alcohol Use: Not on file    Family History  Problem Relation Age of Onset  . Hypertension Mother   . Hypertension Sister     Allergies  Allergen Reactions  . Penicillins     REACTION: as a child    Medication list has been reviewed and updated.  No current outpatient prescriptions on file prior to visit.   No current facility-administered medications on file prior to visit.    Review of Systems:  As per HPI, otherwise negative.    Physical Examination: Filed Vitals:   07/07/14 0839  BP: 128/76  Pulse: 87  Temp: 98.2 F (36.8 C)  Resp: 16    Filed Vitals:   07/07/14 0839  Height: 5' 5.5" (1.664 m)  Weight: 159 lb 3.2 oz (72.213 kg)   Body mass index is 26.08 kg/(m^2). Ideal Body Weight: Weight in (lb) to have BMI = 25: 152.2  GEN: WDWN, NAD, Non-toxic, A & O x 3 HEENT: Atraumatic, Normocephalic. Neck supple. No masses, No LAD. Ears and Nose: No external deformity. CV: RRR, No M/G/R. No JVD. No thrill. No extra heart sounds. PULM: CTA B, no wheezes, crackles, rhonchi. No retractions. No resp. distress. No accessory muscle use. ABD: S, NT, ND, +BS. No rebound. No HSM. EXTR: No c/c/e NEURO Normal gait.  PSYCH: Normally interactive. Conversant. Not depressed or anxious appearing.  Calm demeanor.    Assessment and Plan: CPE Labs pending  Signed,  Ellison Carwin, MD   Results for orders placed in visit on 07/07/14  POCT CBC      Result Value Ref Range   WBC 6.3  4.6 - 10.2 K/uL   Lymph, poc 1.8  0.6 - 3.4   POC LYMPH PERCENT 28.5  10 - 50 %L   MID (cbc) 0.4  0 - 0.9   POC MID % 6.2  0 - 12 %M   POC Granulocyte 4.1  2 -  6.9   Granulocyte percent 65.3  37 - 80 %G   RBC 4.83  4.04 - 5.48 M/uL   Hemoglobin 14.4  12.2 - 16.2 g/dL   HCT, POC 43.6  37.7 - 47.9 %   MCV 90.2  80 - 97 fL   MCH, POC 29.7  27 - 31.2 pg   MCHC 32.9  31.8 - 35.4 g/dL   RDW, POC 14.0     Platelet Count, POC 332  142 - 424 K/uL   MPV 7.4  0 - 99.8 fL  POCT UA - MICROSCOPIC ONLY      Result Value Ref Range   WBC, Ur, HPF, POC neg     RBC, urine, microscopic neg     Bacteria, U Microscopic neg     Mucus, UA neg     Epithelial cells, urine per micros 10-15     Crystals, Ur, HPF, POC neg     Casts, Ur, LPF, POC neg     Yeast, UA neg    POCT URINALYSIS DIPSTICK      Result Value Ref Range   Color, UA yellow     Clarity, UA clear     Glucose, UA neg     Bilirubin, UA neg     Ketones, UA neg     Spec Grav, UA 1.015     Blood, UA neg     pH, UA 6.5     Protein, UA neg     Urobilinogen, UA 0.2     Nitrite, UA neg      Leukocytes, UA Trace     [facility] reading (PRIMARY) by  Dr. Ouida Sills. Negative chest.

## 2014-07-08 MED ORDER — ATORVASTATIN CALCIUM 20 MG PO TABS: 20.0000 mg | ORAL_TABLET | Freq: Every day | ORAL | Status: DC

## 2014-07-08 NOTE — Addendum Note (Signed)
Addended by: Roselee Culver on: 07/08/2014 10:24 AM   Modules accepted: Orders

## 2014-08-25 ENCOUNTER — Encounter: Payer: Self-pay | Admitting: Gastroenterology

## 2014-08-29 ENCOUNTER — Ambulatory Visit: Payer: Medicare Other

## 2014-09-06 ENCOUNTER — Ambulatory Visit: Payer: Self-pay

## 2014-11-01 ENCOUNTER — Telehealth: Payer: Self-pay | Admitting: Family Medicine

## 2014-11-01 NOTE — Telephone Encounter (Signed)
Patient is going to drug store to receive the flu shot

## 2014-11-02 ENCOUNTER — Telehealth: Payer: Self-pay

## 2014-11-02 ENCOUNTER — Other Ambulatory Visit: Payer: Self-pay | Admitting: Emergency Medicine

## 2014-11-02 DIAGNOSIS — Z1239 Encounter for other screening for malignant neoplasm of breast: Secondary | ICD-10-CM

## 2014-11-02 NOTE — Telephone Encounter (Signed)
Was originally ordered 8/13. reordered

## 2014-11-02 NOTE — Telephone Encounter (Signed)
Pt would like to have a referall to have mammogram done. Best# [phone]

## 2014-11-02 NOTE — Telephone Encounter (Signed)
Pt advised.

## 2014-11-16 ENCOUNTER — Ambulatory Visit
Admission: RE | Admit: 2014-11-16 | Discharge: 2014-11-16 | Disposition: A | Discharge status: Home / Self Care | Payer: Medicare Other | Source: Ambulatory Visit | Attending: Emergency Medicine | Admitting: Emergency Medicine

## 2014-11-16 DIAGNOSIS — Z0000 Encounter for general adult medical examination without abnormal findings: Secondary | ICD-10-CM

## 2014-11-17 ENCOUNTER — Other Ambulatory Visit: Payer: Self-pay | Admitting: Emergency Medicine

## 2014-11-17 DIAGNOSIS — R928 Other abnormal and inconclusive findings on diagnostic imaging of breast: Secondary | ICD-10-CM

## 2014-12-05 ENCOUNTER — Ambulatory Visit
Admission: RE | Admit: 2014-12-05 | Discharge: 2014-12-05 | Disposition: A | Discharge status: Home / Self Care | Payer: Medicare Other | Source: Ambulatory Visit | Attending: Emergency Medicine | Admitting: Emergency Medicine

## 2014-12-05 DIAGNOSIS — R928 Other abnormal and inconclusive findings on diagnostic imaging of breast: Secondary | ICD-10-CM

## 2015-05-01 ENCOUNTER — Other Ambulatory Visit: Payer: Self-pay | Admitting: Emergency Medicine

## 2015-05-01 DIAGNOSIS — N6489 Other specified disorders of breast: Secondary | ICD-10-CM

## 2015-06-05 ENCOUNTER — Other Ambulatory Visit: Payer: Medicare Other

## 2015-06-05 ENCOUNTER — Ambulatory Visit
Admission: RE | Admit: 2015-06-05 | Discharge: 2015-06-05 | Disposition: A | Discharge status: Home / Self Care | Payer: Medicare Other | Source: Ambulatory Visit | Attending: Emergency Medicine | Admitting: Emergency Medicine

## 2015-06-05 ENCOUNTER — Other Ambulatory Visit: Payer: Self-pay | Admitting: Emergency Medicine

## 2015-06-05 DIAGNOSIS — N6489 Other specified disorders of breast: Secondary | ICD-10-CM

## 2015-06-06 ENCOUNTER — Ambulatory Visit
Admission: RE | Admit: 2015-06-06 | Discharge: 2015-06-06 | Disposition: A | Discharge status: Home / Self Care | Payer: Medicare Other | Source: Ambulatory Visit | Attending: Emergency Medicine | Admitting: Emergency Medicine

## 2015-06-06 ENCOUNTER — Other Ambulatory Visit: Payer: Self-pay | Admitting: Emergency Medicine

## 2015-06-06 DIAGNOSIS — N6489 Other specified disorders of breast: Secondary | ICD-10-CM

## 2015-06-30 ENCOUNTER — Ambulatory Visit (INDEPENDENT_AMBULATORY_CARE_PROVIDER_SITE_OTHER): Payer: Medicare Other | Admitting: Family Medicine

## 2015-06-30 VITALS — BP 176/86 | HR 68 | Temp 97.9°F | Resp 16 | Ht 66.0 in | Wt 159.6 lb

## 2015-06-30 DIAGNOSIS — E042 Nontoxic multinodular goiter: Secondary | ICD-10-CM

## 2015-06-30 DIAGNOSIS — E785 Hyperlipidemia, unspecified: Secondary | ICD-10-CM | POA: Insufficient documentation

## 2015-06-30 DIAGNOSIS — I10 Essential (primary) hypertension: Secondary | ICD-10-CM | POA: Diagnosis not present

## 2015-06-30 DIAGNOSIS — R635 Abnormal weight gain: Secondary | ICD-10-CM | POA: Diagnosis not present

## 2015-06-30 DIAGNOSIS — F4541 Pain disorder exclusively related to psychological factors: Secondary | ICD-10-CM | POA: Diagnosis not present

## 2015-06-30 LAB — LIPID PANEL
Cholesterol: 261 mg/dL — ABNORMAL HIGH (ref 125–200)
HDL: 44 mg/dL — ABNORMAL LOW (ref >=46)
LDL Cholesterol: 179 mg/dL — ABNORMAL HIGH (ref <130)
TRIGLYCERIDES: 192 mg/dL — AB (ref <150)
Total CHOL/HDL Ratio: 5.9 Ratio — ABNORMAL HIGH (ref <=5.0)
VLDL: 38 mg/dL — ABNORMAL HIGH (ref <30)

## 2015-06-30 LAB — COMPREHENSIVE METABOLIC PANEL
ALBUMIN: 4.4 g/dL (ref 3.6–5.1)
ALK PHOS: 101 U/L (ref 33–130)
ALT: 49 U/L — AB (ref 6–29)
AST: 31 U/L (ref 10–35)
BUN: 13 mg/dL (ref 7–25)
CHLORIDE: 101 mmol/L (ref 98–110)
CO2: 23 mmol/L (ref 20–31)
Calcium: 9.7 mg/dL (ref 8.6–10.4)
Creat: 0.75 mg/dL (ref 0.50–0.99)
GLUCOSE: 99 mg/dL (ref 65–99)
Potassium: 4.8 mmol/L (ref 3.5–5.3)
SODIUM: 136 mmol/L (ref 135–146)
TOTAL PROTEIN: 6.9 g/dL (ref 6.1–8.1)
Total Bilirubin: 1 mg/dL (ref 0.2–1.2)

## 2015-06-30 LAB — TSH: TSH: 5.274 u[IU]/mL — AB (ref 0.350–4.500)

## 2015-06-30 MED ORDER — ROSUVASTATIN CALCIUM 5 MG PO TABS: ORAL_TABLET | ORAL | Status: DC

## 2015-06-30 MED ORDER — TRAMADOL HCL 50 MG PO TABS: 50.0000 mg | ORAL_TABLET | Freq: Three times a day (TID) | ORAL | Status: DC | PRN

## 2015-06-30 MED ORDER — LISINOPRIL-HYDROCHLOROTHIAZIDE 10-12.5 MG PO TABS: 1.0000 | ORAL_TABLET | Freq: Every day | ORAL | Status: DC

## 2015-06-30 NOTE — Progress Notes (Signed)
  Subjective:  Patient ID: Carmen Fernandez, female    DOB: 1945-12-03  Age: 69 y.o. MRN: [id number]  Patient has been having pain in her head and in the right side of the neck. She got a blood pressure taken yesterday and the systolic was 364W. This concerns her. She has not been on any blood pressure medication. She's had steady weight gain over the last several years. She is retired. Walks around with the normal activities of life but does not do formal exercise. She has occasional palpitations at nighttime. She does not have any chest pains. No GI or GU complaints. She gets short of breath sometimes on exertion. She cares for her female friend. She lives alone. 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 A couple of years ago she had a multinodular goiter biopsied and that was benign. When they did the ultrasound they mentioned something in the right side of her neck, then nobody said anything more about that. She was concerned what that was. She does not do regular to doctors. She has a history of high cholesterol but got leg pains from the atorvastatin, and quit taking it.   Objective:   Pleasant alert lady in no acute distress. TMs normal. Throat clear. Eyes PERRLA. Neck supple without nodes thyromegaly. No carotid bruits. I did not feel the nodules significantly on her thyroid. Her chest is clear to all station. Heart regular without murmurs gallops or arrhythmias. Extremities without edema. Repeat blood pressure was 176/86.  Assessment & Plan:   Assessment: Headaches secondary to stress and possibly high blood pressure Hypertension Hyperlipidemia Weight gain History of multinodular goiter  Plan: CBC, C met. Later decided to add a TSH. Begin Crestor one pill a week, then after 2 or 3 weeks ago twice weekly and stayed there until she returns in 3 months for recheck. Take lisinopril HCT as directed. If it does not help and the headaches are continue to persist and neck pain continues to  persist she is to come back in. Patient Instructions  Take lisinopril HCT slip 10/12.5 one pill daily for blood pressure. Best taken in the morning.  After we get the cholesterol results, assuming that it is high, begin taking the Crestor 5 mg 1 pill weekly for 2 weeks, then go to twice weekly. Stay at that dose until we recheck you.  Return in about 3 months  Take the Tylenol 500 mg 2 pills maximum of 3 times daily if needed for headaches. If headache more severe take a tramadol.  Encourage regular exercise and working on decreased dietary intake to try and lose some weight    Jaequan Propes, MD 06/30/2015

## 2015-06-30 NOTE — Patient Instructions (Addendum)
Take lisinopril HCT slip 10/12.5 one pill daily for blood pressure. Best taken in the morning.  After we get the cholesterol results, assuming that it is high, begin taking the Crestor 5 mg 1 pill weekly for 2 weeks, then go to twice weekly. Stay at that dose until we recheck you.  Return in about 3 months  Take the Tylenol 500 mg 2 pills maximum of 3 times daily if needed for headaches. If headache more severe take a tramadol.  Encourage regular exercise and working on decreased dietary intake to try and lose some weight

## 2015-07-04 MED ORDER — LEVOTHYROXINE SODIUM 50 MCG PO TABS: ORAL_TABLET | ORAL | Status: DC

## 2015-07-04 NOTE — Addendum Note (Signed)
Addended by: Constance Goltz on: 07/04/2015 08:43 AM   Modules accepted: Orders, SmartSet

## 2015-08-04 ENCOUNTER — Ambulatory Visit (INDEPENDENT_AMBULATORY_CARE_PROVIDER_SITE_OTHER): Payer: Medicare Other | Admitting: Family Medicine

## 2015-08-04 VITALS — BP 124/80 | HR 69 | Temp 97.5°F | Resp 17 | Ht 66.5 in | Wt 156.0 lb

## 2015-08-04 DIAGNOSIS — R309 Painful micturition, unspecified: Secondary | ICD-10-CM

## 2015-08-04 DIAGNOSIS — N3000 Acute cystitis without hematuria: Secondary | ICD-10-CM

## 2015-08-04 LAB — POCT URINALYSIS DIPSTICK
Bilirubin, UA: NEGATIVE
Glucose, UA: NEGATIVE
Nitrite, UA: NEGATIVE
PH UA: 5.5
PROTEIN UA: 30
Spec Grav, UA: 1.025
Urobilinogen, UA: 0.2

## 2015-08-04 LAB — POCT UA - MICROSCOPIC ONLY
Casts, Ur, LPF, POC: NEGATIVE
Crystals, Ur, HPF, POC: NEGATIVE
Mucus, UA: POSITIVE
YEAST UA: NEGATIVE

## 2015-08-04 MED ORDER — CIPROFLOXACIN HCL 500 MG PO TABS: 500.0000 mg | ORAL_TABLET | Freq: Two times a day (BID) | ORAL | Status: DC

## 2015-08-04 NOTE — Progress Notes (Addendum)
[facility] [address], [place] [address] [phone]  Date:  08/04/2015   Name:  Carmen Fernandez   DOB:  Feb 03, 1946   MRN:  [id number]  PCP:  Roselee Culver, MD    Chief Complaint: pain with urination   History of Present Illness:  Carmen Fernandez is a 69 y.o. very pleasant female patient who presents with the following:  Here today with possible UTI- she has noted sx for about one week- hoped that it would go away but it has persisted. She has noted dysuria in the am, and back pain No fever, body aches or vomiting She has not noted blood in her urine. But it is cloudy She is otherwise generally in good health This seems like a UTI to her   Patient Active Problem List   Diagnosis Date Noted  . Essential hypertension 06/30/2015  . Hyperlipidemia 06/30/2015  . Chest pain 10/27/2012  . Goiter 10/27/2012  . FECAL OCCULT BLOOD 01/01/2011  . NONSPEC ABNORM PAP CERV UNSATISFACTORY CYTOLOGY 01/01/2011  . HYPERTENSION 12/31/2010  . HEMOCCULT POSITIVE STOOL 12/31/2010    Past Medical History  Diagnosis Date  . HYPERTENSION   . HEMOCCULT POSITIVE STOOL   . FECAL OCCULT BLOOD   . NONSPEC ABNORM PAP CERV UNSATISFACTORY CYTOLOGY     Past Surgical History  Procedure Laterality Date  . Cosmetic surgery    . Eye surgery    . Abdominal hysterectomy    . Tubal ligation    . Hernia repair      Social History  Substance Use Topics  . Smoking status: Never Smoker   . Smokeless tobacco: Never Used  . Alcohol Use: 1.2 oz/week    2 Standard drinks or equivalent per week    Family History  Problem Relation Age of Onset  . Hypertension Mother   . Hypertension Sister     Allergies  Allergen Reactions  . Penicillins     REACTION: as a child    Medication list has been reviewed and updated.  Current Outpatient Prescriptions on File Prior to Visit  Medication Sig Dispense Refill  . levothyroxine (SYNTHROID, LEVOTHROID) 50 MCG tablet  Take one each morning at least 1/2 hour before breakfast 30 tablet 3  . lisinopril-hydrochlorothiazide (PRINZIDE,ZESTORETIC) 10-12.5 MG per tablet Take 1 tablet by mouth daily. 90 tablet 3  . rosuvastatin (CRESTOR) 5 MG tablet Take as directed for high cholesterol 30 tablet 3  . traMADol (ULTRAM) 50 MG tablet Take 1 tablet (50 mg total) by mouth every 8 (eight) hours as needed. 30 tablet 0   No current facility-administered medications on file prior to visit.    Review of Systems:  As per HPI- otherwise negative.   Physical Examination: Filed Vitals:   08/04/15 0826  BP: 124/80  Pulse: 69  Temp: 97.5 F (36.4 C)  Resp: 17   Filed Vitals:   08/04/15 0826  Height: 5' 6.5" (1.689 m)  Weight: 156 lb (70.761 kg)   Body mass index is 24.8 kg/(m^2). Ideal Body Weight: Weight in (lb) to have BMI = 25: 156.9  GEN: WDWN, NAD, Non-toxic, A & O x 3, looks well and younger than stated age 65: Atraumatic, Normocephalic. Neck supple. No masses, No LAD. Ears and Nose: No external deformity. CV: RRR, No M/G/R. No JVD. No thrill. No extra heart sounds. PULM: CTA B, no wheezes, crackles, rhonchi. No retractions. No resp. distress. No accessory muscle use. ABD: S, NT, ND. No  rebound. No HSM.  Benign belly EXTR: No c/c/e NEURO Normal gait.  PSYCH: Normally interactive. Conversant. Not depressed or anxious appearing.  Calm demeanor.  No CVA tenderness  Results for orders placed or performed in visit on 08/04/15  POCT UA - Microscopic Only  Result Value Ref Range   WBC, Ur, HPF, POC TNTC    RBC, urine, microscopic 9-13    Bacteria, U Microscopic 3+    Mucus, UA Positive    Epithelial cells, urine per micros 10-15    Crystals, Ur, HPF, POC Negative    Casts, Ur, LPF, POC Negative    Yeast, UA Negative   POCT urinalysis dipstick  Result Value Ref Range   Color, UA Light Brown    Clarity, UA Turbid    Glucose, UA Negative    Bilirubin, UA Negative    Ketones, UA Trace    Spec  Grav, UA 1.025    Blood, UA Moderate    pH, UA 5.5    Protein, UA 30    Urobilinogen, UA 0.2    Nitrite, UA Negative    Leukocytes, UA large (3+) (A) Negative    Assessment and Plan: Acute cystitis without hematuria - Plan: Urine culture, ciprofloxacin (CIPRO) 500 MG tablet  Painful urination - Plan: POCT UA - Microscopic Only, POCT urinalysis dipstick  UTI with back pain- will treat with cipro as she may be developing pyelo. Discussed increased risk of tendon rupture- avoid vigorous exercise until finished with this abx She will let us know if not feeling better in the next 2 days  Signed Lamar Blinks, MD  Called with culture on 9/11- mixed bacteria.  Continue abx but let me know if not feeling better.  Will send letter

## 2015-08-04 NOTE — Patient Instructions (Signed)
It does appear that you have a urinary tract infection Take the cipro twice a day for 5-7 days.  I will be in touch with your urine culture asap If you are not feeling better within a couple of days please let me know- Sooner if worse.

## 2015-08-05 LAB — URINE CULTURE

## 2015-08-06 ENCOUNTER — Encounter: Payer: Self-pay | Admitting: Family Medicine

## 2015-08-06 NOTE — Addendum Note (Signed)
Addended by: Lamar Blinks C on: 08/06/2015 12:10 PM   Modules accepted: Orders

## 2015-08-13 ENCOUNTER — Emergency Department (HOSPITAL_COMMUNITY)
Admission: EM | Admit: 2015-08-13 | Discharge: 2015-08-13 | Disposition: A | Discharge status: Home / Self Care | Payer: Medicare Other | Attending: Emergency Medicine | Admitting: Emergency Medicine

## 2015-08-13 ENCOUNTER — Encounter (HOSPITAL_COMMUNITY): Payer: Self-pay

## 2015-08-13 DIAGNOSIS — I10 Essential (primary) hypertension: Secondary | ICD-10-CM | POA: Insufficient documentation

## 2015-08-13 DIAGNOSIS — Z23 Encounter for immunization: Secondary | ICD-10-CM | POA: Insufficient documentation

## 2015-08-13 DIAGNOSIS — Z79899 Other long term (current) drug therapy: Secondary | ICD-10-CM | POA: Diagnosis not present

## 2015-08-13 DIAGNOSIS — S6991XA Unspecified injury of right wrist, hand and finger(s), initial encounter: Secondary | ICD-10-CM | POA: Diagnosis present

## 2015-08-13 DIAGNOSIS — Z792 Long term (current) use of antibiotics: Secondary | ICD-10-CM | POA: Insufficient documentation

## 2015-08-13 DIAGNOSIS — Y9389 Activity, other specified: Secondary | ICD-10-CM | POA: Diagnosis not present

## 2015-08-13 DIAGNOSIS — Z8719 Personal history of other diseases of the digestive system: Secondary | ICD-10-CM | POA: Diagnosis not present

## 2015-08-13 DIAGNOSIS — S61219A Laceration without foreign body of unspecified finger without damage to nail, initial encounter: Secondary | ICD-10-CM

## 2015-08-13 DIAGNOSIS — Z880 Allergy status to penicillin: Secondary | ICD-10-CM | POA: Insufficient documentation

## 2015-08-13 DIAGNOSIS — Y998 Other external cause status: Secondary | ICD-10-CM | POA: Insufficient documentation

## 2015-08-13 DIAGNOSIS — Y288XXA Contact with other sharp object, undetermined intent, initial encounter: Secondary | ICD-10-CM | POA: Insufficient documentation

## 2015-08-13 DIAGNOSIS — S61215A Laceration without foreign body of left ring finger without damage to nail, initial encounter: Secondary | ICD-10-CM | POA: Diagnosis not present

## 2015-08-13 DIAGNOSIS — Y9289 Other specified places as the place of occurrence of the external cause: Secondary | ICD-10-CM | POA: Insufficient documentation

## 2015-08-13 MED ORDER — IBUPROFEN 800 MG PO TABS: 800.0000 mg | ORAL_TABLET | Freq: Three times a day (TID) | ORAL | Status: DC

## 2015-08-13 MED ORDER — TETANUS-DIPHTH-ACELL PERTUSSIS 5-2.5-18.5 LF-MCG/0.5 IM SUSP
0.5000 mL | Freq: Once | INTRAMUSCULAR | Status: AC
  Administered 2015-08-13: 0.5 mL via INTRAMUSCULAR
  Filled 2015-08-13: qty 0.5

## 2015-08-13 MED ORDER — BACITRACIN ZINC 500 UNIT/GM EX OINT
1.0000 "application " | TOPICAL_OINTMENT | Freq: Two times a day (BID) | CUTANEOUS | Status: DC
  Administered 2015-08-13: 1 via TOPICAL
  Filled 2015-08-13: qty 0.9

## 2015-08-13 NOTE — ED Provider Notes (Signed)
CSN: [id number]     Arrival date & time 08/13/15  1832 History   First MD Initiated Contact with Patient 08/13/15 1859     Chief Complaint  Patient presents with  . finger laceration      (Consider location/radiation/quality/duration/timing/severity/associated sxs/prior Treatment) HPI Comments: The patient is a 69 year old female, she presents to the hospital after accidentally slicing her right ring finger with a mandolin. This was acute in onset, it occurred just prior to arrival, she sliced off the very tip of the finger with the end of the nail but did not involve the proximal nailbed. The bleeding has now stopped, she has tenderness to the tip of the finger but no flap laceration. Symptoms are mild, persistent, worse with palpation.  The history is provided by the patient.    Past Medical History  Diagnosis Date  . HYPERTENSION   . HEMOCCULT POSITIVE STOOL   . FECAL OCCULT BLOOD   . NONSPEC ABNORM PAP CERV UNSATISFACTORY CYTOLOGY    Past Surgical History  Procedure Laterality Date  . Cosmetic surgery    . Eye surgery    . Abdominal hysterectomy    . Tubal ligation    . Hernia repair     Family History  Problem Relation Age of Onset  . Hypertension Mother   . Hypertension Sister    Social History  Substance Use Topics  . Smoking status: Never Smoker   . Smokeless tobacco: Never Used  . Alcohol Use: 1.2 oz/week    2 Standard drinks or equivalent per week   OB History    No data available     Review of Systems  Constitutional: Negative for fever.  Gastrointestinal: Negative for vomiting.  Skin: Positive for wound.       Laceration  Neurological: Negative for weakness and numbness.      Allergies  Penicillins  Home Medications   Prior to Admission medications   Medication Sig Start Date End Date Taking? Authorizing Provider  ciprofloxacin (CIPRO) 500 MG tablet Take 1 tablet (500 mg total) by mouth 2 (two) times daily. 08/04/15   Darreld Mclean, MD   levothyroxine (SYNTHROID, LEVOTHROID) 50 MCG tablet Take one each morning at least 1/2 hour before breakfast 07/04/15   Posey Boyer, MD  lisinopril-hydrochlorothiazide (PRINZIDE,ZESTORETIC) 10-12.5 MG per tablet Take 1 tablet by mouth daily. 06/30/15   Posey Boyer, MD  rosuvastatin (CRESTOR) 5 MG tablet Take as directed for high cholesterol 06/30/15   Posey Boyer, MD  traMADol (ULTRAM) 50 MG tablet Take 1 tablet (50 mg total) by mouth every 8 (eight) hours as needed. 06/30/15   Posey Boyer, MD   BP 156/77 mmHg  Pulse 70  Temp(Src) 98.7 F (37.1 C) (Oral)  Resp 20  SpO2 100% Physical Exam  Constitutional: She appears well-developed and well-nourished. No distress.  HENT:  Head: Normocephalic.  Eyes: Conjunctivae are normal. No scleral icterus.  Cardiovascular: Normal rate and regular rhythm.   Pulmonary/Chest: Effort normal and breath sounds normal.  Musculoskeletal: Normal range of motion. She exhibits tenderness ( ttp over the laceration site). She exhibits no edema.  Neurological: She is alert. Coordination normal.  Sensation and motor intact  Skin: Skin is warm and dry. She is not diaphoretic.  Laceration located on tip of the left ring finger The Laceration is able to, no flap, no repairable edges The depth is superficial The length is approximately 5 mm in diameter    ED Course  Procedures (including critical  care time) Labs Review Labs Reviewed - No data to display  Imaging Review No results found. I have personally reviewed and evaluated these images and lab results as part of my medical decision-making.    MDM   Final diagnoses:  None    Well-appearing, a full shin laceration, nothing to repair, no indication for imaging, full range of motion of the finger with normal flexion and extension, local wound care, bacitracin, nondistended dressing, can follow-up with family doctor. Patient is in agreement.  Wounds cleansed and dressed, no wound in need of  repair.  Meds given in ED:  Medications - No data to display  New Prescriptions   IBUPROFEN (ADVIL,MOTRIN) 800 MG TABLET    Take 1 tablet (800 mg total) by mouth 3 (three) times daily.      Noemi Chapel, MD 08/13/15 2016

## 2015-08-13 NOTE — ED Notes (Signed)
Patient was using a mandolin and cut her right ring finger. Bleeding has stopped.

## 2015-12-20 ENCOUNTER — Other Ambulatory Visit: Payer: Self-pay | Admitting: Emergency Medicine

## 2015-12-20 DIAGNOSIS — N6099 Unspecified benign mammary dysplasia of unspecified breast: Secondary | ICD-10-CM

## 2015-12-20 DIAGNOSIS — R921 Mammographic calcification found on diagnostic imaging of breast: Secondary | ICD-10-CM

## 2015-12-26 ENCOUNTER — Telehealth: Payer: Self-pay

## 2015-12-26 NOTE — Telephone Encounter (Signed)
Will check Dr boxes

## 2015-12-26 NOTE — Telephone Encounter (Signed)
[facility] stating that they faxed in an order for this Pt. Yesterday and wanted and update. She is suppose to have a diagnostic of her left breast. Also says they sent a message to Dr. Ouida Sills, unsure of who is checking his messages since he is no longer here. Call back number is [phone].

## 2016-01-03 ENCOUNTER — Ambulatory Visit
Admission: RE | Admit: 2016-01-03 | Discharge: 2016-01-03 | Disposition: A | Discharge status: Home / Self Care | Payer: Medicare Other | Source: Ambulatory Visit | Attending: Emergency Medicine | Admitting: Emergency Medicine

## 2016-01-03 ENCOUNTER — Other Ambulatory Visit: Payer: Self-pay | Admitting: Emergency Medicine

## 2016-01-03 DIAGNOSIS — R921 Mammographic calcification found on diagnostic imaging of breast: Secondary | ICD-10-CM

## 2016-01-03 DIAGNOSIS — N6099 Unspecified benign mammary dysplasia of unspecified breast: Secondary | ICD-10-CM

## 2016-01-03 NOTE — Telephone Encounter (Signed)
Pt had mammogram.

## 2016-01-17 ENCOUNTER — Ambulatory Visit (HOSPITAL_COMMUNITY)
Admission: RE | Admit: 2016-01-17 | Discharge: 2016-01-17 | Disposition: A | Discharge status: Home / Self Care | Payer: Medicare Other | Source: Ambulatory Visit | Attending: Vascular Surgery | Admitting: Vascular Surgery

## 2016-01-17 ENCOUNTER — Ambulatory Visit (INDEPENDENT_AMBULATORY_CARE_PROVIDER_SITE_OTHER): Payer: Medicare Other | Admitting: Emergency Medicine

## 2016-01-17 ENCOUNTER — Ambulatory Visit
Admission: RE | Admit: 2016-01-17 | Discharge: 2016-01-17 | Disposition: A | Discharge status: Home / Self Care | Payer: Medicare Other | Source: Ambulatory Visit | Attending: Emergency Medicine | Admitting: Emergency Medicine

## 2016-01-17 VITALS — BP 138/68 | HR 68 | Temp 97.6°F | Resp 16 | Ht 66.75 in | Wt 151.6 lb

## 2016-01-17 DIAGNOSIS — E785 Hyperlipidemia, unspecified: Secondary | ICD-10-CM | POA: Diagnosis not present

## 2016-01-17 DIAGNOSIS — H579 Unspecified disorder of eye and adnexa: Secondary | ICD-10-CM

## 2016-01-17 DIAGNOSIS — R479 Unspecified speech disturbances: Secondary | ICD-10-CM

## 2016-01-17 DIAGNOSIS — I6523 Occlusion and stenosis of bilateral carotid arteries: Secondary | ICD-10-CM | POA: Insufficient documentation

## 2016-01-17 DIAGNOSIS — I10 Essential (primary) hypertension: Secondary | ICD-10-CM | POA: Diagnosis not present

## 2016-01-17 LAB — CBC WITH DIFFERENTIAL/PLATELET
Basophils Absolute: 0.1 10*3/uL (ref 0.0–0.1)
Basophils Relative: 1 % (ref 0–1)
Eosinophils Absolute: 0.1 10*3/uL (ref 0.0–0.7)
Eosinophils Relative: 2 % (ref 0–5)
HEMATOCRIT: 42.5 % (ref 36.0–46.0)
HEMOGLOBIN: 14.7 g/dL (ref 12.0–15.0)
LYMPHS PCT: 30 % (ref 12–46)
Lymphs Abs: 1.6 10*3/uL (ref 0.7–4.0)
MCH: 30.4 pg (ref 26.0–34.0)
MCHC: 34.6 g/dL (ref 30.0–36.0)
MCV: 87.8 fL (ref 78.0–100.0)
MONO ABS: 0.4 10*3/uL (ref 0.1–1.0)
MONOS PCT: 7 % (ref 3–12)
MPV: 10 fL (ref 8.6–12.4)
NEUTROS ABS: 3.2 10*3/uL (ref 1.7–7.7)
Neutrophils Relative %: 60 % (ref 43–77)
Platelets: 347 10*3/uL (ref 150–400)
RBC: 4.84 MIL/uL (ref 3.87–5.11)
RDW: 13.6 % (ref 11.5–15.5)
WBC: 5.4 10*3/uL (ref 4.0–10.5)

## 2016-01-17 LAB — COMPLETE METABOLIC PANEL WITH GFR
ALBUMIN: 4.3 g/dL (ref 3.6–5.1)
ALK PHOS: 93 U/L (ref 33–130)
ALT: 25 U/L (ref 6–29)
AST: 19 U/L (ref 10–35)
BILIRUBIN TOTAL: 0.9 mg/dL (ref 0.2–1.2)
BUN: 13 mg/dL (ref 7–25)
CO2: 24 mmol/L (ref 20–31)
CREATININE: 0.75 mg/dL (ref 0.60–0.93)
Calcium: 9.5 mg/dL (ref 8.6–10.4)
Chloride: 98 mmol/L (ref 98–110)
GFR, Est Non African American: 81 mL/min (ref >=60)
GLUCOSE: 102 mg/dL — AB (ref 65–99)
Potassium: 4.2 mmol/L (ref 3.5–5.3)
SODIUM: 134 mmol/L — AB (ref 135–146)
TOTAL PROTEIN: 7 g/dL (ref 6.1–8.1)

## 2016-01-17 LAB — TSH: TSH: 3.24 mIU/L

## 2016-01-17 MED ORDER — ALPRAZOLAM 0.25 MG PO TABS: 0.2500 mg | ORAL_TABLET | Freq: Two times a day (BID) | ORAL | Status: DC | PRN

## 2016-01-17 NOTE — Progress Notes (Addendum)
By signing my name below, I, [name], attest that this documentation has been prepared under the direction and in the presence of [name], MD. Electronically Signed: [name], [place]. 01/17/2016 , 8:58 AM .  Patient was seen in room 2 .  Chief Complaint:  Chief Complaint  Patient presents with  . Anxiety  . Stress  . OTHER    Patient states She "saw flashes" and was unable to make complete sentences Saturday  . Migraine    HPI: Carmen Fernandez is a 70 y.o. female who has h/o HTN and HLD reports to [facility] today complaining of anxiety and stress causing her headaches. She states that she saw "bright flashes" in both eyes, but went away after 2-3 minutes. She has a history of cataracts so she thought it may have been floaters. She mentions having a hard time communicating and finding words about 20-30 minutes after that occurrence. She was ordering lunch and was hesitating on her words. This also lasted about 2-3 minutes. Neither of these have occurred again; however, she's been having headaches since. She denies having active headache in the office. She denies smoking.   She's been dealing with a lot of family stress.  Her sister moved in with her 2 months ago, and is helping her get situated with her own place.  Her mother recently fell and has been taking care of her.  She's also moving to [place] in 2 days, as she's getting back together with her ex-husband. She'll be driving down there.   Past Medical History  Diagnosis Date  . HYPERTENSION   . HEMOCCULT POSITIVE STOOL   . FECAL OCCULT BLOOD   . NONSPEC ABNORM PAP CERV UNSATISFACTORY CYTOLOGY    Past Surgical History  Procedure Laterality Date  . Cosmetic surgery    . Eye surgery    . Abdominal hysterectomy    . Tubal ligation    . Hernia repair     Social History   Social History  . Marital Status: Single    Spouse Name: N/A  . Number of Children: N/A  . Years of Education: N/A   Social History  Main Topics  . Smoking status: Never Smoker   . Smokeless tobacco: Never Used  . Alcohol Use: 1.2 oz/week    2 Standard drinks or equivalent per week  . Drug Use: No  . Sexual Activity: Not Asked   Other Topics Concern  . None   Social History Narrative   Family History  Problem Relation Age of Onset  . Hypertension Mother   . Hypertension Sister    Allergies  Allergen Reactions  . Penicillins     REACTION: as a child  . Sulfur    Prior to Admission medications   Medication Sig Start Date End Date Taking? Authorizing Provider  lisinopril-hydrochlorothiazide (PRINZIDE,ZESTORETIC) 10-12.5 MG per tablet Take 1 tablet by mouth daily. 06/30/15  Yes Posey Boyer, MD  ciprofloxacin (CIPRO) 500 MG tablet Take 1 tablet (500 mg total) by mouth 2 (two) times daily. Patient not taking: Reported on 01/17/2016 08/04/15   Darreld Mclean, MD  ibuprofen (ADVIL,MOTRIN) 800 MG tablet Take 1 tablet (800 mg total) by mouth 3 (three) times daily. Patient not taking: Reported on 01/17/2016 08/13/15   Noemi Chapel, MD  levothyroxine (SYNTHROID, LEVOTHROID) 50 MCG tablet Take one each morning at least 1/2 hour before breakfast Patient not taking: Reported on 01/17/2016 07/04/15   Posey Boyer, MD  rosuvastatin (CRESTOR) 5  MG tablet Take as directed for high cholesterol Patient not taking: Reported on 01/17/2016 06/30/15   Posey Boyer, MD  traMADol (ULTRAM) 50 MG tablet Take 1 tablet (50 mg total) by mouth every 8 (eight) hours as needed. Patient not taking: Reported on 01/17/2016 06/30/15   Posey Boyer, MD     ROS:  Constitutional: negative for fever, chills, night sweats, weight changes, or fatigue  HEENT: negative for vision changes, hearing loss, congestion, rhinorrhea, ST, epistaxis, or sinus pressure Cardiovascular: negative for chest pain or palpitations Respiratory: negative for hemoptysis, wheezing, shortness of breath, or cough Abdominal: negative for abdominal pain, nausea, vomiting,  diarrhea, or constipation Dermatological: negative for rash Neurologic: negative for dizziness, or syncope; positive for headache Psych: positive for anxiety All other systems reviewed and are otherwise negative with the exception to those above and in the HPI.  PHYSICAL EXAM: Filed Vitals:   01/17/16 0823  BP: 138/68  Pulse: 68  Temp: 97.6 F (36.4 C)  Resp: 16   Body mass index is 23.93 kg/(m^2).   General: Alert, no acute distress HEENT:  Normocephalic, atraumatic, oropharynx patent. Eye: [facility] Cardiovascular:  Regular rate and rhythm, no rubs murmurs or gallops.  No Carotid bruits, radial pulse intact. No pedal edema.  Respiratory: Clear to auscultation bilaterally.  No wheezes, rales, or rhonchi.  No cyanosis, no use of accessory musculature Abdominal: No organomegaly, abdomen is soft and non-tender, positive bowel sounds. No masses. Musculoskeletal: Gait intact. No edema, tenderness Skin: No rashes. Neurologic: Facial musculature symmetric, Cranial nerves 2-12 intact, disc margins not distinct, no hemorrhages seen, DTR's normal, finger-to-nose test normal  Psychiatric: Patient acts appropriately throughout our interaction.  Lymphatic: No cervical or submandibular lymphadenopathy Genitourinary/Anorectal: No acute findings  LABS:   EKG/XRAY:   Primary read interpreted by Dr. Everlene Farrier at [facility]. EKG shows a sinus rhythm. There is a low voltage R-wave in V3 raising the question of previous anterior injury but no ST-T changes are noted.   ASSESSMENT/PLAN:  Not clear what is going on the present time. I'm not willing to say this is all stress related. I did give her some 0.25 Xanax to take if she feels overwhelmed. Will start 325 mg coated aspirin one a day she will have a CT head without contrast and carotid Dopplers with follow-up in [place] if these tests are normal head CT returned normal. I personally performed the services described in this documentation, which was  scribed in my presence. The recorded information has been reviewed and is accurate. Gross sideeffects, risk and benefits, and alternatives of medications d/w patient. Patient is aware that all medications have potential sideeffects and we are unable to predict every sideeffect or drug-drug interaction that may occur.  [name] MD 01/17/2016 8:58 AM

## 2016-01-17 NOTE — Patient Instructions (Addendum)
You have 2 appointments today: 1. 1030am you are to be at [facility] [address]  [phone] 2. 330pm CT scan of your head [facility]  [phone]     Transient Ischemic Attack A transient ischemic attack (TIA) is a "warning stroke" that causes stroke-like symptoms. Unlike a stroke, a TIA does not cause permanent damage to the brain. The symptoms of a TIA can happen very fast and do not last long. It is important to know the symptoms of a TIA and what to do. This can help prevent a major stroke or death. CAUSES  A TIA is caused by a temporary blockage in an artery in the brain or neck (carotid artery). The blockage does not allow the brain to get the blood supply it needs and can cause different symptoms. The blockage can be caused by either:  A blood clot.  Fatty buildup (plaque) in a neck or brain artery. RISK FACTORS  High blood pressure (hypertension).  High cholesterol.  Diabetes mellitus.  Heart disease.  The buildup of plaque in the blood vessels (peripheral artery disease or atherosclerosis).  The buildup of plaque in the blood vessels that provide blood and oxygen to the brain (carotid artery stenosis).  An abnormal heart rhythm (atrial fibrillation).  Obesity.  Using any tobacco products, including cigarettes, chewing tobacco, or electronic cigarettes.  Taking oral contraceptives, especially in combination with using tobacco.  Physical inactivity.  A diet high in fats, salt (sodium), and calories.  Excessive alcohol use.  Use of illegal drugs (especially cocaine and methamphetamine).  Being female.  Being African American.  Being over the age of 55 years.  Family history of stroke.  Previous history of blood clots, stroke, TIA, or heart attack.  Sickle cell disease. SIGNS AND SYMPTOMS  TIA symptoms are the same as a stroke but are temporary. These symptoms usually develop suddenly, or may be  newly present upon waking from sleep:  Sudden weakness or numbness of the face, arm, or leg, especially on one side of the body.  Sudden trouble walking or difficulty moving arms or legs.  Sudden confusion.  Sudden personality changes.  Trouble speaking (aphasia) or understanding.  Difficulty swallowing.  Sudden trouble seeing in one or both eyes.  Double vision.  Dizziness.  Loss of balance or coordination.  Sudden severe headache with no known cause.  Trouble reading or writing.  Loss of bowel or bladder control.  Loss of consciousness. DIAGNOSIS  Your health care provider may be able to determine the presence or absence of a TIA based on your symptoms, history, and physical exam. CT scan of the brain is usually performed to help identify a TIA. Other tests may include:  Electrocardiography (ECG).  Continuous heart monitoring.  Echocardiography.  Carotid ultrasonography.  MRI.  A scan of the brain circulation.  Blood tests. TREATMENT  Since the symptoms of TIA are the same as a stroke, it is important to seek treatment as soon as possible. You may need a medicine to dissolve a blood clot (thrombolytic) if that is the cause of the TIA. This medicine cannot be given if too much time has passed. Treatment may also include:   Rest, oxygen, fluids through an IV tube, and medicines to thin the blood (anticoagulants).  Measures will be taken to prevent short-term and long-term complications, including infection from breathing foreign material into the lungs (aspiration pneumonia), blood clots in the legs, and falls.  Procedures to either remove  plaque in the carotid arteries or dilate carotid arteries that have narrowed due to plaque. Those procedures are:  Carotid endarterectomy.  Carotid angioplasty and stenting.  Medicines and diet may be used to address diabetes, high blood pressure, and other underlying risk factors. HOME CARE INSTRUCTIONS   Take medicines  only as directed by your health care provider. Follow the directions carefully. Medicines may be used to control risk factors for a stroke. Be sure you understand all your medicine instructions.  You may be told to take aspirin or the anticoagulant warfarin. Warfarin needs to be taken exactly as instructed.  Taking too much or too little warfarin is dangerous. Too much warfarin increases the risk of bleeding. Too little warfarin continues to allow the risk for blood clots. While taking warfarin, you will need to have regular blood tests to measure your blood clotting time. A PT blood test measures how long it takes for blood to clot. Your PT is used to calculate another value called an INR. Your PT and INR help your health care provider to adjust your dose of warfarin. The dose can change for many reasons. It is critically important that you take warfarin exactly as prescribed.  Many foods, especially foods high in vitamin K can interfere with warfarin and affect the PT and INR. Foods high in vitamin K include spinach, kale, broccoli, cabbage, collard and turnip greens, Brussels sprouts, peas, cauliflower, seaweed, and parsley, as well as beef and pork liver, green tea, and soybean oil. You should eat a consistent amount of foods high in vitamin K. Avoid major changes in your diet, or notify your health care provider before changing your diet. Arrange a visit with a dietitian to answer your questions.  Many medicines can interfere with warfarin and affect the PT and INR. You must tell your health care provider about any and all medicines you take; this includes all vitamins and supplements. Be especially cautious with aspirin and anti-inflammatory medicines. Do not take or discontinue any prescribed or over-the-counter medicine except on the advice of your health care provider or pharmacist.  Warfarin can have side effects, such as excessive bruising or bleeding. You will need to hold pressure over cuts  for longer than usual. Your health care provider or pharmacist will discuss other potential side effects.  Avoid sports or activities that may cause injury or bleeding.  Be careful when shaving, flossing your teeth, or handling sharp objects.  Alcohol can change the body's ability to handle warfarin. It is best to avoid alcoholic drinks or consume only very small amounts while taking warfarin. Notify your health care provider if you change your alcohol intake.  Notify your dentist or other health care providers before procedures.  Eat a diet that includes 5 or more servings of fruits and vegetables each day. This may reduce the risk of stroke. Certain diets may be prescribed to address high blood pressure, high cholesterol, diabetes, or obesity.  A diet low in sodium, saturated fat, trans fat, and cholesterol is recommended to manage high blood pressure.  A diet low in saturated fat, trans fat, and cholesterol, and high in fiber may control cholesterol levels.  A controlled-carbohydrate, controlled-sugar diet is recommended to manage diabetes.  A reduced-calorie diet that is low in sodium, saturated fat, trans fat, and cholesterol is recommended to manage obesity.  Maintain a healthy weight.  Stay physically active. It is recommended that you get at least 30 minutes of activity on most or all days.  Do  not use any tobacco products, including cigarettes, chewing tobacco, or electronic cigarettes. If you need help quitting, ask your health care provider.  Limit alcohol intake to no more than 1 drink per day for nonpregnant women and 2 drinks per day for men. One drink equals 12 ounces of beer, 5 ounces of wine, or 1 ounces of hard liquor.  Do not abuse drugs.  A safe home environment is important to reduce the risk of falls. Your health care provider may arrange for specialists to evaluate your home. Having grab bars in the bedroom and bathroom is often important. Your health care  provider may arrange for equipment to be used at home, such as raised toilets and a seat for the shower.  Follow all instructions for follow-up with your health care provider. This is very important. This includes any referrals and lab tests. Proper follow-up can prevent a stroke or another TIA from occurring. PREVENTION  The risk of a TIA can be decreased by appropriately treating high blood pressure, high cholesterol, diabetes, heart disease, and obesity, and by quitting smoking, limiting alcohol, and staying physically active. SEEK MEDICAL CARE IF:  You have personality changes.  You have difficulty swallowing.  You are seeing double.  You have dizziness.  You have a fever. SEEK IMMEDIATE MEDICAL CARE IF:  Any of the following symptoms may represent a serious problem that is an emergency. Do not wait to see if the symptoms will go away. Get medical help right away. Call your local emergency services (911 in [place] Do not drive yourself to the hospital.  You have sudden weakness or numbness of the face, arm, or leg, especially on one side of the body.  You have sudden trouble walking or difficulty moving arms or legs.  You have sudden confusion.  You have trouble speaking (aphasia) or understanding.  You have sudden trouble seeing in one or both eyes.  You have a loss of balance or coordination.  You have a sudden, severe headache with no known cause.  You have new chest pain or an irregular heartbeat.  You have a partial or total loss of consciousness. MAKE SURE YOU:   Understand these instructions.  Will watch your condition.  Will get help right away if you are not doing well or get worse.   This information is not intended to replace advice given to you by your health care provider. Make sure you discuss any questions you have with your health care provider.   Document Released: 08/21/2005 Document Revised: 12/02/2014 Document Reviewed: 02/16/2014 Elsevier  Interactive Patient Education [employer].

## 2016-01-18 LAB — SEDIMENTATION RATE: Sed Rate: 1 mm/hr (ref 0–30)

## 2016-01-24 ENCOUNTER — Encounter: Payer: Self-pay | Admitting: Internal Medicine

## 2016-03-07 ENCOUNTER — Telehealth: Payer: Self-pay

## 2016-03-07 NOTE — Telephone Encounter (Signed)
Spoke with pt, she has moved to [place] but her blood pressures are going up and down. She is trying to find a doctor but it is really hard because of her insurance. She is still taking her Lisinopril everyday. Her readings are below:  167/80 162/80 172/77 148/103 122/93 115/66 150/78 122/66  She would also like a refill on her Xanax if possible as well. Her moving and worried about her bp is making her anxious. Please advise.

## 2016-03-07 NOTE — Telephone Encounter (Signed)
Since her blood pressures are fluctuating so much I would not recommend a change at the present time. It is very important that she establish herself with a physician in [place] as soon as possible. I am not allowed to send a prescription for Xanax to [place].

## 2016-03-07 NOTE — Telephone Encounter (Signed)
Pt has questions about her blood pressure what should the readings be   Best number [phone]

## 2016-03-11 NOTE — Telephone Encounter (Signed)
SPoke with pt, advised her of message. Pt understood.

## 2016-10-23 IMAGING — CT CT HEAD W/O CM
1 series · 16 of 27 positions shown, 20 images · non-contrast
Comparison: 09/25/2012

CLINICAL DATA: Visual disturbance for 3 days with slurred speech
and headaches

EXAM:
CT HEAD WITHOUT CONTRAST
TECHNIQUE: Contiguous axial images were obtained from the base of the skull
through the vertex without intravenous contrast.

[Series 2: head w/(date) · axial · 0.39mm/px · z∈[+1194,+1314]mm · 16 of 27 slices shown, 20 images]
[im 2/27  brain]
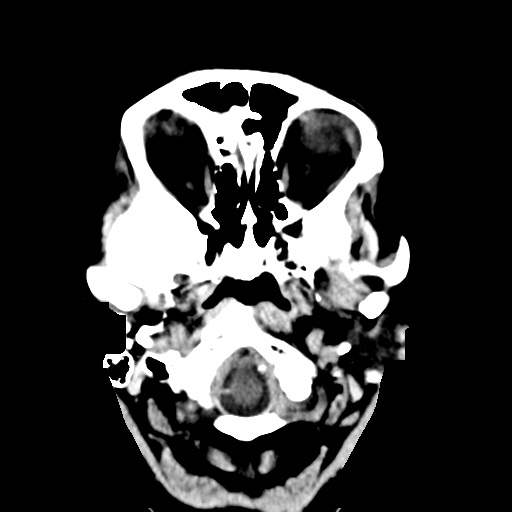
[im 2/27  bone]
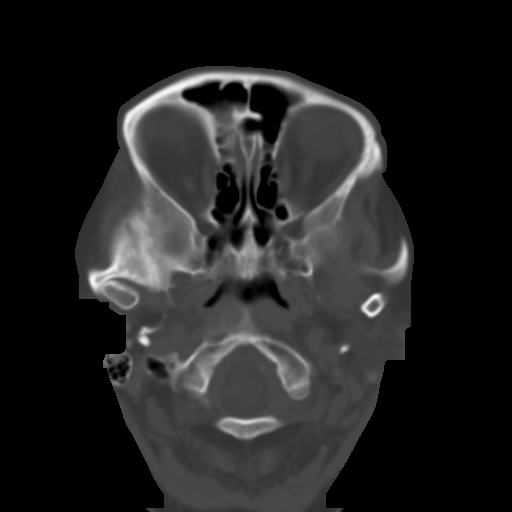
[im 4/27  brain]
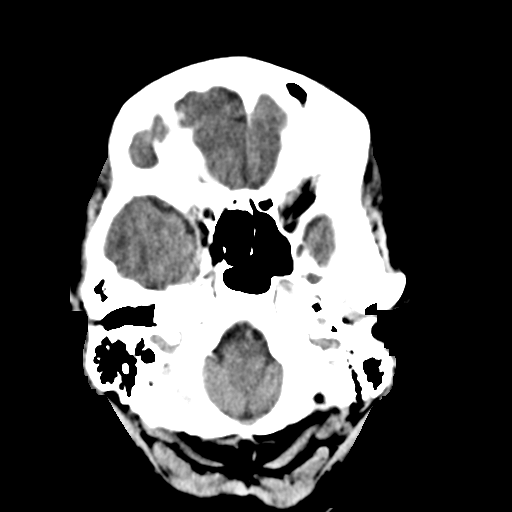
[im 5/27  brain]
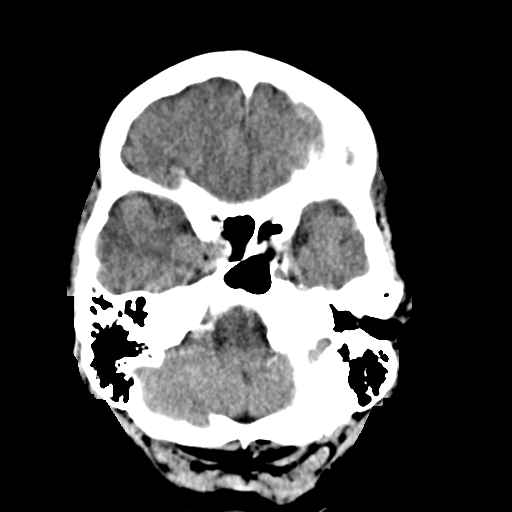
[im 7/27  brain]
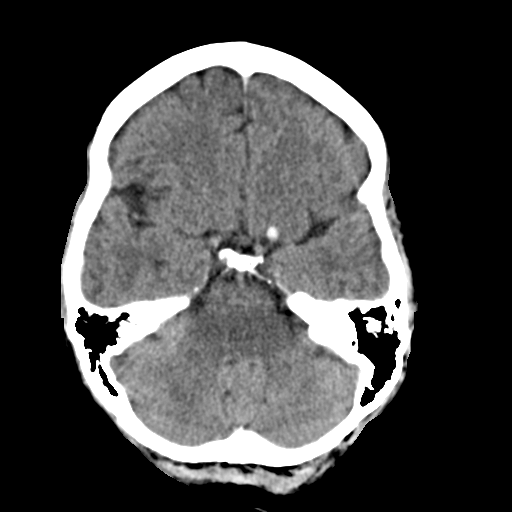
[im 9/27  brain]
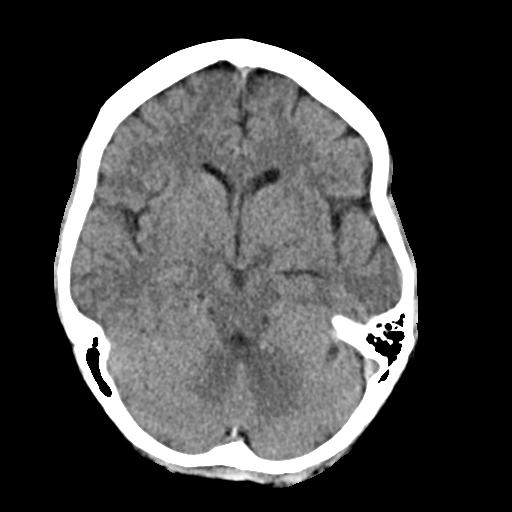
[im 9/27  bone]
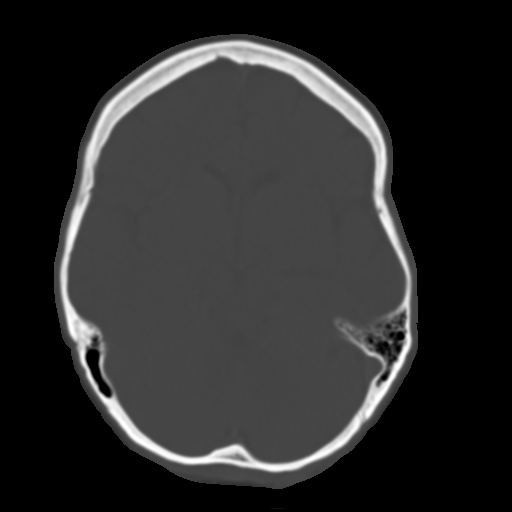
[im 10/27  brain]
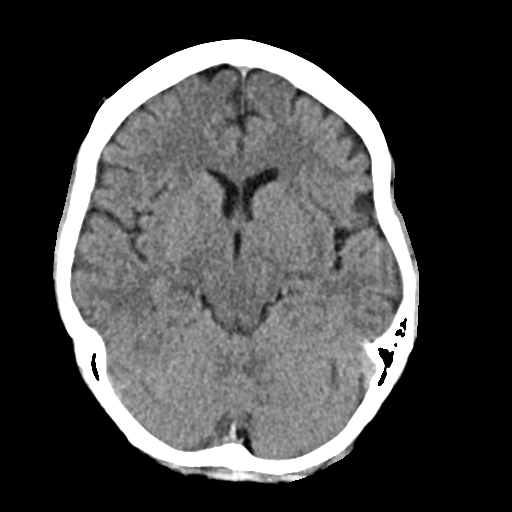
[im 12/27  brain]
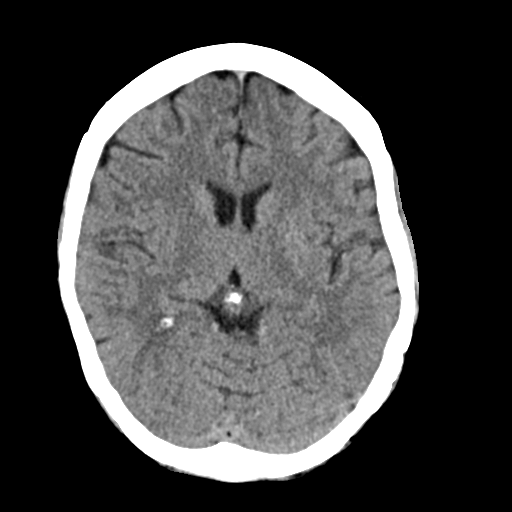
[im 13/27  brain]
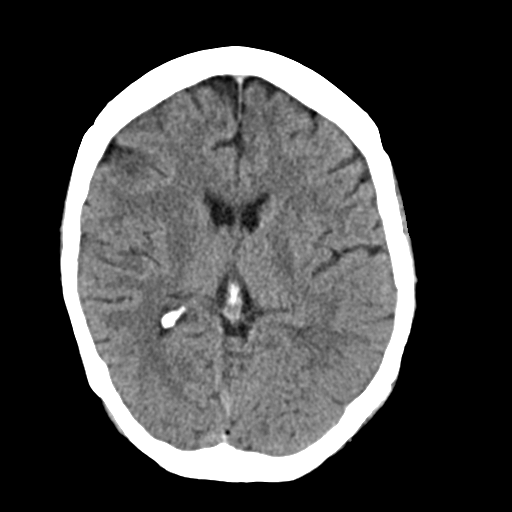
[im 15/27  brain]
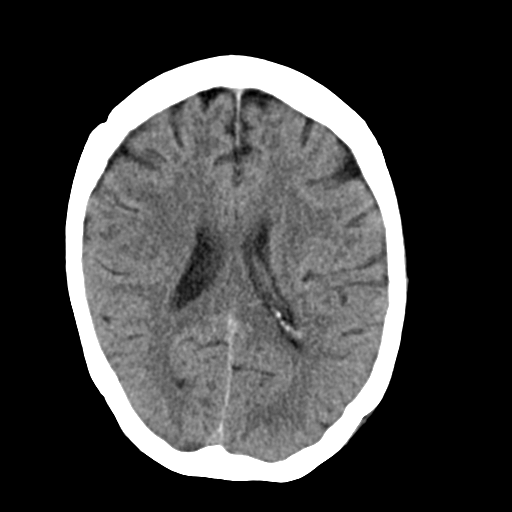
[im 15/27  bone]
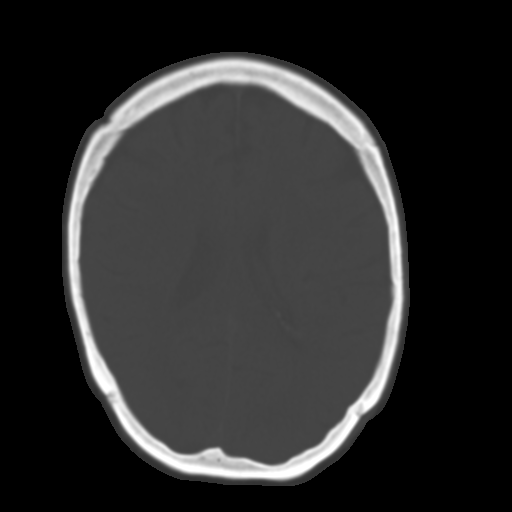
[im 16/27  brain]
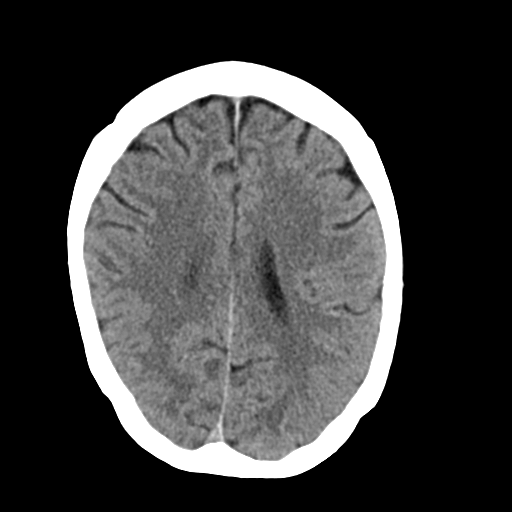
[im 18/27  brain]
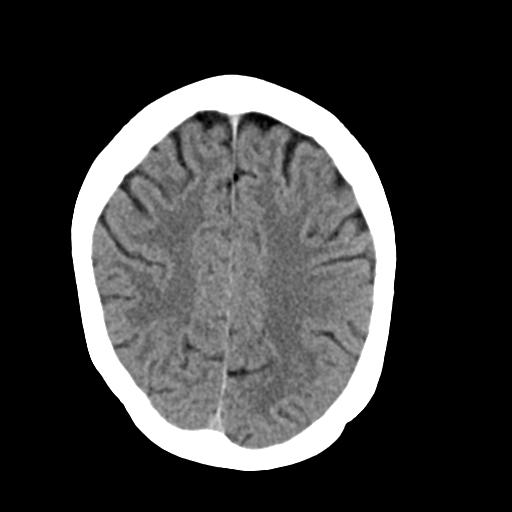
[im 19/27  brain]
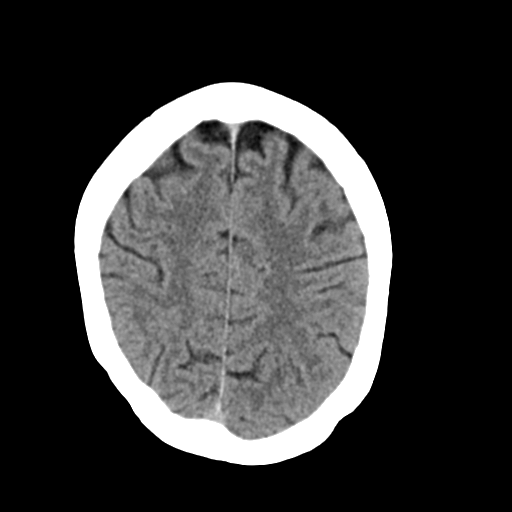
[im 21/27  brain]
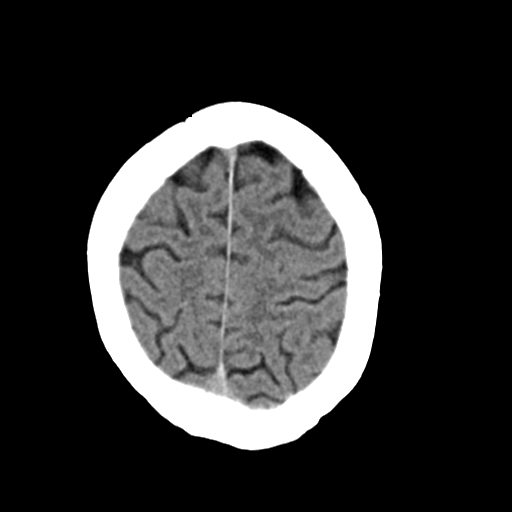
[im 21/27  bone]
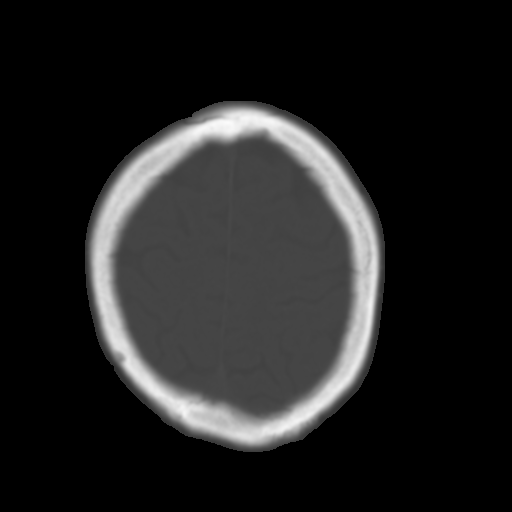
[im 23/27  brain]
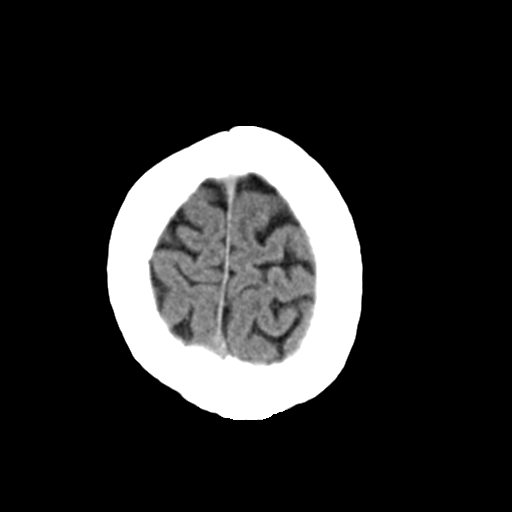
[im 24/27  brain]
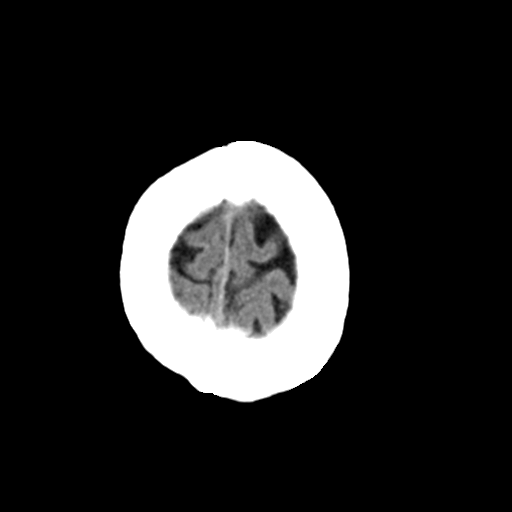
[im 26/27  brain]
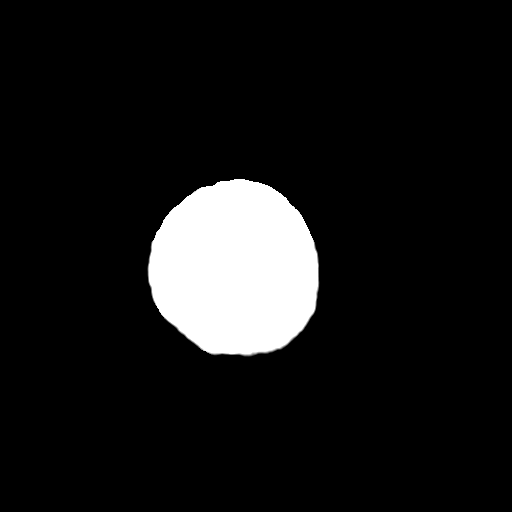

[16 of 27 positions shown; findings below may reference images not displayed]

FINDINGS: The bony calvarium is intact. No findings to suggest acute
hemorrhage, acute infarction or space-occupying mass lesion are
seen.
IMPRESSION: No acute abnormality noted.

## 2017-03-12 ENCOUNTER — Telehealth: Payer: Self-pay | Admitting: General Practice

## 2017-03-12 NOTE — Telephone Encounter (Signed)
I informed pt that I was able to faxed her imaging reports to [facility] in [place]. I am not allowed to fax any x-rays because she had them done at [facility]. She needs to call [facility] and give a verbal authorization to them

## 2020-06-05 ENCOUNTER — Ambulatory Visit
Admission: RE | Admit: 2020-06-05 | Discharge: 2020-06-05 | Disposition: A | Discharge status: Home / Self Care | Payer: Medicare Other | Source: Ambulatory Visit | Attending: Physician Assistant | Admitting: Physician Assistant

## 2020-06-05 ENCOUNTER — Other Ambulatory Visit: Payer: Self-pay | Admitting: Physician Assistant

## 2020-06-05 DIAGNOSIS — M7062 Trochanteric bursitis, left hip: Secondary | ICD-10-CM

## 2020-06-08 ENCOUNTER — Other Ambulatory Visit: Payer: Self-pay

## 2020-06-08 ENCOUNTER — Encounter: Payer: Self-pay | Admitting: Family Medicine

## 2020-06-08 ENCOUNTER — Ambulatory Visit (INDEPENDENT_AMBULATORY_CARE_PROVIDER_SITE_OTHER): Payer: Medicare Other | Admitting: Family Medicine

## 2020-06-08 DIAGNOSIS — M25552 Pain in left hip: Secondary | ICD-10-CM

## 2020-06-08 NOTE — Progress Notes (Signed)
Office Visit Note   Patient: Carmen Fernandez           Date of Birth: 1946-08-11           MRN: [id number] Visit Date: 06/08/2020 Requested by: No referring provider defined for this encounter. PCP: [name], PA  Subjective: Chief Complaint  Patient presents with  . Left Hip - Pain    Started feeling some pain in the lateral hip approx 2 weeks ago, with walking. 2-3 days ago, she had increased pain in the hip, with swelling of the upper thigh area. Is better today.    HPI: She is here with left hip pain.  Symptoms started about 2 weeks ago, no injury.  Gradual onset of pain on the lateral aspect of her hip.  This past Monday it got substantially worse to the point that she felt like it was going to give way, but since then it has gotten significantly better.  She started using milk of magnesia and vitamin D3.  Denies any groin pain.  No history of stress fracture.  She is otherwise in good health.  She states that last November her mother moved in with her and is now sleeping in her room.  Patient is sleeping on a couch.  She thinks this might be contributing to her recent pain.               ROS:   All other systems were reviewed and are negative.  Objective: Vital Signs: There were no vitals taken for this visit.  Physical Exam:  General:  Alert and oriented, in no acute distress. Pulm:  Breathing unlabored. Psy:  Normal mood, congruent affect. Skin: No rash Left hip: No tenderness in the posterior hip.  Very minimal tenderness at the greater trochanter, and that is where she was feeling pain.  No pain with passive internal hip rotation.  Lower extremity strength and reflexes are normal.  Imaging: Recent x-rays were reviewed on computer showing no sign of DJD or stress fracture.  Assessment & Plan: 1.  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 -Showed her stretching exercises and lateral leg raises for strengthening.  She will  continue with vitamin D and magnesium.  Follow-up as needed.     Procedures: No procedures performed  No notes on file     PMFS History: Patient Active Problem List   Diagnosis Date Noted  . Essential hypertension 06/30/2015  . Hyperlipidemia 06/30/2015  . Chest pain 10/27/2012  . Goiter 10/27/2012  . FECAL OCCULT BLOOD 01/01/2011  . NONSPEC ABNORM PAP CERV UNSATISFACTORY CYTOLOGY 01/01/2011  . HYPERTENSION 12/31/2010  . HEMOCCULT POSITIVE STOOL 12/31/2010   Past Medical History:  Diagnosis Date  . FECAL OCCULT BLOOD   . HEMOCCULT POSITIVE STOOL   . HYPERTENSION   . NONSPEC ABNORM PAP CERV UNSATISFACTORY CYTOLOGY     Family History  Problem Relation Age of Onset  . Hypertension Mother   . Hypertension Sister     Past Surgical History:  Procedure Laterality Date  . ABDOMINAL HYSTERECTOMY    . COSMETIC SURGERY    . EYE SURGERY    . HERNIA REPAIR    . TUBAL LIGATION     Social History   Occupational History  . Not on file  Tobacco Use  . Smoking status: Never Smoker  . Smokeless tobacco: Never Used  Substance and Sexual Activity  . Alcohol use: Yes    Alcohol/week: 2.0 standard drinks  Types: 2 Standard drinks or equivalent per week  . Drug use: No  . Sexual activity: Not on file

## 2020-12-28 DIAGNOSIS — Z1211 Encounter for screening for malignant neoplasm of colon: Secondary | ICD-10-CM | POA: Diagnosis not present

## 2020-12-28 DIAGNOSIS — E783 Hyperchylomicronemia: Secondary | ICD-10-CM | POA: Diagnosis not present

## 2020-12-28 DIAGNOSIS — E7800 Pure hypercholesterolemia, unspecified: Secondary | ICD-10-CM | POA: Diagnosis not present

## 2020-12-28 DIAGNOSIS — Z1389 Encounter for screening for other disorder: Secondary | ICD-10-CM | POA: Diagnosis not present

## 2020-12-28 DIAGNOSIS — Z780 Asymptomatic menopausal state: Secondary | ICD-10-CM | POA: Diagnosis not present

## 2020-12-28 DIAGNOSIS — Z5181 Encounter for therapeutic drug level monitoring: Secondary | ICD-10-CM | POA: Diagnosis not present

## 2020-12-28 DIAGNOSIS — I10 Essential (primary) hypertension: Secondary | ICD-10-CM | POA: Diagnosis not present

## 2020-12-28 DIAGNOSIS — E559 Vitamin D deficiency, unspecified: Secondary | ICD-10-CM | POA: Diagnosis not present

## 2020-12-28 DIAGNOSIS — Z0000 Encounter for general adult medical examination without abnormal findings: Secondary | ICD-10-CM | POA: Diagnosis not present

## 2020-12-29 ENCOUNTER — Other Ambulatory Visit: Payer: Self-pay | Admitting: Physician Assistant

## 2021-01-01 ENCOUNTER — Other Ambulatory Visit: Payer: Self-pay | Admitting: Physician Assistant

## 2021-01-01 DIAGNOSIS — E2839 Other primary ovarian failure: Secondary | ICD-10-CM

## 2021-01-25 DIAGNOSIS — N644 Mastodynia: Secondary | ICD-10-CM | POA: Diagnosis not present

## 2021-01-26 ENCOUNTER — Other Ambulatory Visit: Payer: Self-pay | Admitting: Physician Assistant

## 2021-01-26 DIAGNOSIS — N644 Mastodynia: Secondary | ICD-10-CM

## 2021-03-12 DIAGNOSIS — E783 Hyperchylomicronemia: Secondary | ICD-10-CM | POA: Diagnosis not present

## 2021-03-13 ENCOUNTER — Other Ambulatory Visit: Payer: Self-pay

## 2021-03-13 ENCOUNTER — Ambulatory Visit
Admission: RE | Admit: 2021-03-13 | Discharge: 2021-03-13 | Disposition: A | Discharge status: Home / Self Care | Payer: Medicare Other | Source: Ambulatory Visit | Attending: Physician Assistant | Admitting: Physician Assistant

## 2021-03-13 ENCOUNTER — Ambulatory Visit: Payer: Medicare Other

## 2021-03-13 DIAGNOSIS — N644 Mastodynia: Secondary | ICD-10-CM

## 2021-03-13 DIAGNOSIS — R922 Inconclusive mammogram: Secondary | ICD-10-CM | POA: Diagnosis not present

## 2021-03-26 DIAGNOSIS — H40013 Open angle with borderline findings, low risk, bilateral: Secondary | ICD-10-CM | POA: Diagnosis not present

## 2021-03-26 DIAGNOSIS — H52203 Unspecified astigmatism, bilateral: Secondary | ICD-10-CM | POA: Diagnosis not present

## 2021-03-26 DIAGNOSIS — Z961 Presence of intraocular lens: Secondary | ICD-10-CM | POA: Diagnosis not present

## 2021-05-31 ENCOUNTER — Other Ambulatory Visit: Payer: Self-pay

## 2021-05-31 ENCOUNTER — Ambulatory Visit
Admission: RE | Admit: 2021-05-31 | Discharge: 2021-05-31 | Disposition: A | Discharge status: Home / Self Care | Payer: Medicare Other | Source: Ambulatory Visit | Attending: Physician Assistant | Admitting: Physician Assistant

## 2021-05-31 DIAGNOSIS — M8589 Other specified disorders of bone density and structure, multiple sites: Secondary | ICD-10-CM | POA: Diagnosis not present

## 2021-05-31 DIAGNOSIS — E2839 Other primary ovarian failure: Secondary | ICD-10-CM

## 2021-05-31 DIAGNOSIS — Z780 Asymptomatic menopausal state: Secondary | ICD-10-CM | POA: Diagnosis not present

## 2021-06-06 DIAGNOSIS — E7800 Pure hypercholesterolemia, unspecified: Secondary | ICD-10-CM | POA: Diagnosis not present

## 2021-06-28 DIAGNOSIS — Z1211 Encounter for screening for malignant neoplasm of colon: Secondary | ICD-10-CM | POA: Diagnosis not present

## 2021-06-28 DIAGNOSIS — I10 Essential (primary) hypertension: Secondary | ICD-10-CM | POA: Diagnosis not present

## 2021-06-28 DIAGNOSIS — E7800 Pure hypercholesterolemia, unspecified: Secondary | ICD-10-CM | POA: Diagnosis not present

## 2021-06-30 ENCOUNTER — Inpatient Hospital Stay (HOSPITAL_COMMUNITY)
Admission: EM | Admit: 2021-06-30 | Discharge: 2021-07-03 | DRG: 287 | Disposition: A | Discharge status: Home / Self Care | Payer: Medicare Other | Attending: Cardiovascular Disease | Admitting: Cardiovascular Disease

## 2021-06-30 ENCOUNTER — Emergency Department (HOSPITAL_COMMUNITY): Payer: Medicare Other

## 2021-06-30 DIAGNOSIS — Z882 Allergy status to sulfonamides status: Secondary | ICD-10-CM

## 2021-06-30 DIAGNOSIS — I249 Acute ischemic heart disease, unspecified: Secondary | ICD-10-CM | POA: Diagnosis not present

## 2021-06-30 DIAGNOSIS — R42 Dizziness and giddiness: Secondary | ICD-10-CM | POA: Diagnosis not present

## 2021-06-30 DIAGNOSIS — Z90710 Acquired absence of both cervix and uterus: Secondary | ICD-10-CM | POA: Diagnosis not present

## 2021-06-30 DIAGNOSIS — Z880 Allergy status to penicillin: Secondary | ICD-10-CM | POA: Diagnosis not present

## 2021-06-30 DIAGNOSIS — I471 Supraventricular tachycardia: Secondary | ICD-10-CM | POA: Diagnosis not present

## 2021-06-30 DIAGNOSIS — R231 Pallor: Secondary | ICD-10-CM | POA: Diagnosis not present

## 2021-06-30 DIAGNOSIS — Z79899 Other long term (current) drug therapy: Secondary | ICD-10-CM

## 2021-06-30 DIAGNOSIS — I25110 Atherosclerotic heart disease of native coronary artery with unstable angina pectoris: Secondary | ICD-10-CM | POA: Diagnosis not present

## 2021-06-30 DIAGNOSIS — R0789 Other chest pain: Secondary | ICD-10-CM | POA: Diagnosis not present

## 2021-06-30 DIAGNOSIS — R439 Unspecified disturbances of smell and taste: Secondary | ICD-10-CM | POA: Diagnosis not present

## 2021-06-30 DIAGNOSIS — R079 Chest pain, unspecified: Secondary | ICD-10-CM | POA: Diagnosis not present

## 2021-06-30 DIAGNOSIS — R0602 Shortness of breath: Secondary | ICD-10-CM | POA: Diagnosis not present

## 2021-06-30 DIAGNOSIS — E119 Type 2 diabetes mellitus without complications: Secondary | ICD-10-CM | POA: Diagnosis present

## 2021-06-30 DIAGNOSIS — R002 Palpitations: Secondary | ICD-10-CM | POA: Diagnosis not present

## 2021-06-30 DIAGNOSIS — Z8249 Family history of ischemic heart disease and other diseases of the circulatory system: Secondary | ICD-10-CM | POA: Diagnosis not present

## 2021-06-30 DIAGNOSIS — I10 Essential (primary) hypertension: Secondary | ICD-10-CM | POA: Diagnosis not present

## 2021-06-30 DIAGNOSIS — Z9851 Tubal ligation status: Secondary | ICD-10-CM

## 2021-06-30 DIAGNOSIS — Z20822 Contact with and (suspected) exposure to covid-19: Secondary | ICD-10-CM | POA: Diagnosis present

## 2021-06-30 LAB — BASIC METABOLIC PANEL
Anion gap: 6 (ref 5–15)
BUN: 14 mg/dL (ref 8–23)
CO2: 25 mmol/L (ref 22–32)
Calcium: 9.2 mg/dL (ref 8.9–10.3)
Chloride: 110 mmol/L (ref 98–111)
Creatinine, Ser: 0.82 mg/dL (ref 0.44–1.00)
GFR, Estimated: >60 mL/min (ref >60)
Glucose, Bld: 112 mg/dL — ABNORMAL HIGH (ref 70–99)
Potassium: 3.5 mmol/L (ref 3.5–5.1)
Sodium: 141 mmol/L (ref 135–145)

## 2021-06-30 LAB — CBC WITH DIFFERENTIAL/PLATELET
Abs Immature Granulocytes: 0.02 10*3/uL (ref 0.00–0.07)
Basophils Absolute: 0.1 10*3/uL (ref 0.0–0.1)
Basophils Relative: 1 %
Eosinophils Absolute: 0.1 10*3/uL (ref 0.0–0.5)
Eosinophils Relative: 1 %
HCT: 39 % (ref 36.0–46.0)
Hemoglobin: 13 g/dL (ref 12.0–15.0)
Immature Granulocytes: 0 %
Lymphocytes Relative: 25 %
Lymphs Abs: 1.8 10*3/uL (ref 0.7–4.0)
MCH: 30.4 pg (ref 26.0–34.0)
MCHC: 33.3 g/dL (ref 30.0–36.0)
MCV: 91.1 fL (ref 80.0–100.0)
Monocytes Absolute: 0.5 10*3/uL (ref 0.1–1.0)
Monocytes Relative: 7 %
Neutro Abs: 4.7 10*3/uL (ref 1.7–7.7)
Neutrophils Relative %: 66 %
Platelets: 314 10*3/uL (ref 150–400)
RBC: 4.28 MIL/uL (ref 3.87–5.11)
RDW: 13.2 % (ref 11.5–15.5)
WBC: 7.1 10*3/uL (ref 4.0–10.5)
nRBC: 0 % (ref 0.0–0.2)

## 2021-06-30 LAB — TROPONIN I (HIGH SENSITIVITY)
Troponin I (High Sensitivity): 42 ng/L — ABNORMAL HIGH (ref <18)
Troponin I (High Sensitivity): 127 ng/L (ref <18)

## 2021-06-30 MED ORDER — ATORVASTATIN CALCIUM 80 MG PO TABS
80.0000 mg | ORAL_TABLET | Freq: Every day | ORAL | Status: DC
  Administered 2021-07-01 – 2021-07-03 (×3): 80 mg via ORAL
  Filled 2021-06-30 (×3): qty 1

## 2021-06-30 MED ORDER — ACETAMINOPHEN 325 MG PO TABS: 650.0000 mg | ORAL_TABLET | ORAL | Status: DC | PRN

## 2021-06-30 MED ORDER — NITROGLYCERIN 0.4 MG SL SUBL: 0.4000 mg | SUBLINGUAL_TABLET | SUBLINGUAL | Status: DC | PRN

## 2021-06-30 MED ORDER — VITAMIN B-12 1000 MCG PO TABS
1000.0000 ug | ORAL_TABLET | Freq: Every day | ORAL | Status: DC
  Administered 2021-07-01 – 2021-07-03 (×3): 1000 ug via ORAL
  Filled 2021-06-30 (×3): qty 1

## 2021-06-30 MED ORDER — METOPROLOL TARTRATE 12.5 MG HALF TABLET
12.5000 mg | ORAL_TABLET | Freq: Two times a day (BID) | ORAL | Status: DC
  Administered 2021-07-01 – 2021-07-03 (×5): 12.5 mg via ORAL
  Filled 2021-06-30 (×5): qty 1

## 2021-06-30 MED ORDER — ACETAMINOPHEN 500 MG PO TABS
1000.0000 mg | ORAL_TABLET | Freq: Four times a day (QID) | ORAL | Status: DC | PRN
  Administered 2021-07-01: 1000 mg via ORAL
  Filled 2021-06-30: qty 2

## 2021-06-30 MED ORDER — ASPIRIN EC 81 MG PO TBEC
81.0000 mg | DELAYED_RELEASE_TABLET | Freq: Every day | ORAL | Status: DC
  Administered 2021-07-01 – 2021-07-03 (×3): 81 mg via ORAL
  Filled 2021-06-30 (×3): qty 1

## 2021-06-30 MED ORDER — POLYVINYL ALCOHOL 1.4 % OP SOLN: 1.0000 [drp] | Freq: Every day | OPHTHALMIC | Status: DC | PRN

## 2021-06-30 MED ORDER — VITAMIN D 25 MCG (1000 UNIT) PO TABS
1000.0000 [IU] | ORAL_TABLET | Freq: Every day | ORAL | Status: DC
  Administered 2021-07-01 – 2021-07-03 (×3): 1000 [IU] via ORAL
  Filled 2021-06-30 (×3): qty 1

## 2021-06-30 MED ORDER — SODIUM CHLORIDE 0.9 % IV BOLUS
1000.0000 mL | Freq: Once | INTRAVENOUS | Status: AC
  Administered 2021-06-30: 1000 mL via INTRAVENOUS

## 2021-06-30 MED ORDER — ONDANSETRON HCL 4 MG/2ML IJ SOLN: 4.0000 mg | Freq: Four times a day (QID) | INTRAMUSCULAR | Status: DC | PRN

## 2021-06-30 MED ORDER — SODIUM CHLORIDE 0.9 % IV SOLN: INTRAVENOUS | Status: DC

## 2021-06-30 NOTE — ED Notes (Signed)
EDP is at the bedside.

## 2021-06-30 NOTE — ED Notes (Signed)
Pt ambulated w/ steady gait to restroom. 

## 2021-06-30 NOTE — H&P (Signed)
Referring Physician: Dr. Darl Householder, MD/Byouk, MD  Carmen Fernandez is an 76 y.o. female.                       Chief Complaint: Chest pain and palpitation  HPI: 75 years old white female with h/o hypertension was store shopping when she had chest pressure, shortness of breath and dizziness with palpitation. Chest pressure radiated to left shoulder and neck. She had narrow complex SVT for 20 + minutes and on arrival to ER she was in sinus rhythm. Her EKG shows normal sinus rhythm. Chest x-ray is unremarkable. Her Troponin I levels are minimally elevated and trending upward. She has no known COVID infection or cough or fever but has noticed loss of taste x 3 months.   Past Medical History:  Diagnosis Date   FECAL OCCULT BLOOD    HEMOCCULT POSITIVE STOOL    HYPERTENSION    NONSPEC ABNORM PAP CERV UNSATISFACTORY CYTOLOGY       Past Surgical History:  Procedure Laterality Date   ABDOMINAL HYSTERECTOMY     COSMETIC SURGERY     EYE SURGERY     HERNIA REPAIR     TUBAL LIGATION      Family History  Problem Relation Age of Onset   Hypertension Mother    Hypertension Sister    Social History:  reports that she has never smoked. She has never used smokeless tobacco. She reports current alcohol use of about 2.0 standard drinks of alcohol per week. She reports that she does not use drugs.  Allergies:  Allergies  Allergen Reactions   Elemental Sulfur    Penicillins     REACTION: as a child    (Not in a hospital admission)   Results for orders placed or performed during the hospital encounter of 06/30/21 (from the past 48 hour(s))  CBC with Differential     Status: None   Collection Time: 06/30/21  4:21 PM  Result Value Ref Range   WBC 7.1 4.0 - 10.5 K/uL   RBC 4.28 3.87 - 5.11 MIL/uL   Hemoglobin 13.0 12.0 - 15.0 g/dL   HCT 39.0 36.0 - 46.0 %   MCV 91.1 80.0 - 100.0 fL   MCH 30.4 26.0 - 34.0 pg   MCHC 33.3 30.0 - 36.0 g/dL   RDW 13.2 11.5 - 15.5 %   Platelets 314 150 - 400 K/uL    nRBC 0.0 0.0 - 0.2 %   Neutrophils Relative % 66 %   Neutro Abs 4.7 1.7 - 7.7 K/uL   Lymphocytes Relative 25 %   Lymphs Abs 1.8 0.7 - 4.0 K/uL   Monocytes Relative 7 %   Monocytes Absolute 0.5 0.1 - 1.0 K/uL   Eosinophils Relative 1 %   Eosinophils Absolute 0.1 0.0 - 0.5 K/uL   Basophils Relative 1 %   Basophils Absolute 0.1 0.0 - 0.1 K/uL   Immature Granulocytes 0 %   Abs Immature Granulocytes 0.02 0.00 - 0.07 K/uL    Comment: Performed at [facility], [address] [place], [place] [id number]  Basic metabolic panel     Status: Abnormal   Collection Time: 06/30/21  4:21 PM  Result Value Ref Range   Sodium 141 135 - 145 mmol/L   Potassium 3.5 3.5 - 5.1 mmol/L   Chloride 110 98 - 111 mmol/L   CO2 25 22 - 32 mmol/L   Glucose, Bld 112 (H) 70 - 99 mg/dL  Comment: Glucose reference range applies only to samples taken after fasting for at least 8 hours.   BUN 14 8 - 23 mg/dL   Creatinine, Ser 0.82 0.44 - 1.00 mg/dL   Calcium 9.2 8.9 - 10.3 mg/dL   GFR, Estimated >60 >60 mL/min    Comment: (NOTE) Calculated using the CKD-EPI Creatinine Equation (2021)    Anion gap 6 5 - 15    Comment: Performed at [facility] [place] [address] [place], [place] [address]  Troponin I (High Sensitivity)     Status: Abnormal   Collection Time: 06/30/21  4:21 PM  Result Value Ref Range   Troponin I (High Sensitivity) 42 (H) <18 ng/L    Comment: (NOTE) Elevated high sensitivity troponin I (hsTnI) values and significant  changes across serial measurements may suggest ACS but many other  chronic and acute conditions are known to elevate hsTnI results.  Refer to the "Links" section for chest pain algorithms and additional  guidance. Performed at [facility], [place] [address] [place], [place] [address]   Troponin I (High Sensitivity)     Status: Abnormal   Collection Time: 06/30/21  8:17 PM  Result Value Ref Range   Troponin I (High Sensitivity) 127 (HH) <18 ng/L    Comment:  CRITICAL RESULT CALLED TO, READ BACK BY AND VERIFIED WITH:  Alfonzo Beers RN '@[id number]'$  06/30/21 K. SANDERS (NOTE) Elevated high sensitivity troponin I (hsTnI) values and significant  changes across serial measurements may suggest ACS but many other  chronic and acute conditions are known to elevate hsTnI results.  Refer to the Links section for chest pain algorithms and additional  guidance. Performed at [facility], [place] [address], [place], [place] [address]    DG Chest Portable 1 View  Result Date: 06/30/2021 CLINICAL DATA:  Shortness of breath. EXAM: PORTABLE CHEST 1 VIEW COMPARISON:  07/07/2014 FINDINGS: The cardiomediastinal silhouette is unremarkable. There is no evidence of focal airspace disease, pulmonary edema, suspicious pulmonary nodule/mass, pleural effusion, or pneumothorax. No acute bony abnormalities are identified. IMPRESSION: No active disease. Electronically Signed   By: Margarette Canada M.D.   On: 06/30/2021 16:30    Review Of Systems Constitutional: No fever, chills, weight loss or gain. Eyes: No vision change, wears glasses. No discharge or pain. Ears, nose throat: No hearing loss, No tinnitus. Positive loss of taste x 3 months. Respiratory: No asthma, COPD, pneumonias. No shortness of breath. No hemoptysis. Cardiovascular: Positive chest pain, palpitation, no leg edema. Gastrointestinal: No nausea, vomiting, diarrhea, constipation. No GI bleed. No hepatitis. Genitourinary: No dysuria, hematuria, kidney stone. No incontinance. Neurological: No headache, stroke, seizures.  Psychiatry: No psych facility admission for anxiety, depression, suicide. No detox. Skin: No rash. Musculoskeletal: Positive joint pain, fibromyalgia. No neck pain, back pain. Lymphadenopathy: No lymphadenopathy. Hematology: No anemia or easy bruising.   Blood pressure (!) 155/106, pulse 61, temperature (!) 97.4 F (36.3 C), resp. rate 13, SpO2 98 %. There is no height or weight on file to calculate  BMI. General appearance: alert, cooperative, appears stated age and no distress Head: Normocephalic, atraumatic. Eyes: Blue eyes, pink conjunctiva, corneas clear.  Neck: No adenopathy, no carotid bruit, no JVD, supple, symmetrical, trachea midline and thyroid not enlarged. Resp: Clear to auscultation bilaterally. Cardio: Regular rate and rhythm, S1, S2 normal, II/VI systolic murmur, no click, rub or gallop GI: Soft, non-tender; bowel sounds normal; no organomegaly. Extremities: No edema, cyanosis or clubbing. Skin: Warm and dry.  Neurologic: Alert  and oriented X 3, normal strength.   Assessment/Plan Acute coronary syndrome Hypertension Paroxysmal SVT  Plan: IV heparin/ASA/B-blocker/Atorvastatin. Echocardiogram for LV function. Discussed cardiac catheterization with possible intervention procedure, risks and alternatives and patient agrees to the procedure.  Time spent: Review of old records, Lab, x-rays, EKG, other cardiac tests, examination, discussion with patient/Nurse/ER doctor over 70 minutes.  [name], MD  06/30/2021, 11:43 PM

## 2021-06-30 NOTE — ED Provider Notes (Signed)
[facility] EMERGENCY DEPARTMENT Provider Note   CSN: [id number] Arrival date & time: 06/30/21  1506     History Chief Complaint  Patient presents with   Chest Pain    Carmen Fernandez is a 75 y.o. female.  Patient is a 75 year old female with no significant past medical history who is presenting for SVT.  Patient was found to be in SVT with heart rates in the 180s with EMS.  She states that she was at the store shopping when she felt chest pressure, shortness of breath and dizziness.  The chest pressure radiated toward her left shoulder and left neck.  She denies any nausea or diaphoresis.  She was given IV fluids and tried vagal maneuvers with EMS.  Upon arrival to the ED she is no longer in SVT.  She is in normal sinus rhythm.  She has no complaints at this time.  She denies any chest pain, shortness of breath, nausea, diaphoresis, weakness.  She denies any cardiac history.  She denies any smoking history or hyperlipidemia.  Chest Pain Pain location:  L chest Pain quality: dull   Pain radiates to:  L jaw and L shoulder Onset quality:  Sudden Duration:  1 day Associated symptoms: no abdominal pain, no cough, no diaphoresis, no dizziness, no dysphagia, no fatigue, no fever, no headache, no nausea, no numbness, no palpitations, no shortness of breath, no vomiting and no weakness       Past Medical History:  Diagnosis Date   FECAL OCCULT BLOOD    HEMOCCULT POSITIVE STOOL    HYPERTENSION    NONSPEC ABNORM PAP CERV UNSATISFACTORY CYTOLOGY     Patient Active Problem List   Diagnosis Date Noted   Acute coronary syndrome ([place]) 06/30/2021   Essential hypertension 06/30/2015   Hyperlipidemia 06/30/2015   Chest pain 10/27/2012   Goiter 10/27/2012   FECAL OCCULT BLOOD 01/01/2011   NONSPEC ABNORM PAP CERV UNSATISFACTORY CYTOLOGY 01/01/2011   HYPERTENSION 12/31/2010   HEMOCCULT POSITIVE STOOL 12/31/2010    Past Surgical History:  Procedure Laterality Date    ABDOMINAL HYSTERECTOMY     COSMETIC SURGERY     EYE SURGERY     HERNIA REPAIR     TUBAL LIGATION       OB History   No obstetric history on file.     Family History  Problem Relation Age of Onset   Hypertension Mother    Hypertension Sister     Social History   Tobacco Use   Smoking status: Never   Smokeless tobacco: Never  Substance Use Topics   Alcohol use: Yes    Alcohol/week: 2.0 standard drinks    Types: 2 Standard drinks or equivalent per week   Drug use: No    Home Medications Prior to Admission medications   Medication Sig Start Date End Date Taking? Authorizing Provider  acetaminophen (TYLENOL) 500 MG tablet Take 1,000 mg by mouth every 6 (six) hours as needed for moderate pain.   Yes [provider]  atorvastatin (LIPITOR) 10 MG tablet Take 10 mg by mouth daily.   Yes [provider]  Carboxymethylcellulose Sodium (ARTIFICIAL TEARS OP) Place 1 drop into both eyes daily as needed (dry eyes).   Yes [provider]  Cholecalciferol (VITAMIN D3) 25 MCG (1000 UT) CAPS Take 1,000 Units by mouth daily.   Yes [provider]  vitamin B-12 (CYANOCOBALAMIN) 1000 MCG tablet Take 1,000 mcg by mouth daily.   Yes [provider]  Allergies    Elemental sulfur and Penicillins  Review of Systems   Review of Systems  Constitutional:  Negative for chills, diaphoresis, fatigue and fever.  HENT:  Negative for congestion, dental problem, ear discharge, ear pain, facial swelling, hearing loss, nosebleeds, postnasal drip, rhinorrhea, sinus pain, sneezing, sore throat and trouble swallowing.   Eyes:  Negative for pain and visual disturbance.  Respiratory:  Negative for cough, chest tightness, shortness of breath, wheezing and stridor.   Cardiovascular:  Positive for chest pain. Negative for palpitations and leg swelling.  Gastrointestinal:  Negative for abdominal distention, abdominal pain, blood in stool, constipation, diarrhea,  nausea and vomiting.  Endocrine: Negative for polydipsia and polyuria.  Genitourinary:  Negative for difficulty urinating, dysuria, flank pain, frequency, hematuria, urgency, vaginal bleeding and vaginal discharge.  Musculoskeletal:  Negative for myalgias, neck pain and neck stiffness.  Skin:  Negative for rash and wound.  Allergic/Immunologic: Negative for environmental allergies and food allergies.  Neurological:  Negative for dizziness, seizures, syncope, facial asymmetry, speech difficulty, weakness, light-headedness, numbness and headaches.  Psychiatric/Behavioral:  Negative for agitation, behavioral problems and confusion.    Physical Exam Updated Vital Signs BP (!) 169/89   Pulse 68   Temp (!) 97.4 F (36.3 C)   Resp 13   Ht '5\' 6"'$  (1.676 m)   Wt 66.2 kg   SpO2 98%   BMI 23.57 kg/m   Physical Exam Vitals and nursing note reviewed.  Constitutional:      General: She is not in acute distress.    Appearance: Normal appearance. She is normal weight. She is not ill-appearing.  HENT:     Head: Normocephalic and atraumatic.     Right Ear: External ear normal.     Left Ear: External ear normal.     Nose: Nose normal. No congestion.     Mouth/Throat:     Mouth: Mucous membranes are moist.     Pharynx: Oropharynx is clear. No oropharyngeal exudate or posterior oropharyngeal erythema.  Eyes:     General: No visual field deficit.    Extraocular Movements: Extraocular movements intact.     Conjunctiva/sclera: Conjunctivae normal.     Pupils: Pupils are equal, round, and reactive to light.  Neck:     Vascular: No carotid bruit.  Cardiovascular:     Rate and Rhythm: Normal rate and regular rhythm.     Pulses: Normal pulses.     Heart sounds: Normal heart sounds. No murmur heard. Pulmonary:     Effort: Pulmonary effort is normal. No respiratory distress.     Breath sounds: Normal breath sounds. No stridor. No wheezing, rhonchi or rales.  Chest:     Chest wall: No tenderness.   Abdominal:     General: Bowel sounds are normal. There is no distension.     Palpations: Abdomen is soft.     Tenderness: There is no abdominal tenderness. There is no right CVA tenderness, left CVA tenderness, guarding or rebound.  Musculoskeletal:        General: No swelling or tenderness. Normal range of motion.     Cervical back: Normal range of motion and neck supple. No rigidity, tenderness or bony tenderness.     Thoracic back: Normal. No tenderness or bony tenderness.     Lumbar back: Normal. No tenderness or bony tenderness.     Right lower leg: No edema.     Left lower leg: No edema.  Skin:    General: Skin is warm and dry.  Coloration: Skin is not jaundiced.  Neurological:     General: No focal deficit present.     Mental Status: She is alert and oriented to person, place, and time. Mental status is at baseline.     Cranial Nerves: Cranial nerves are intact. No cranial nerve deficit, dysarthria or facial asymmetry.     Sensory: Sensation is intact. No sensory deficit.     Motor: Motor function is intact. No weakness.     Coordination: Coordination is intact. Finger-Nose-Finger Test normal.     Gait: Gait is intact. Gait normal.  Psychiatric:        Mood and Affect: Mood normal.        Behavior: Behavior normal.        Thought Content: Thought content normal.        Judgment: Judgment normal.    ED Results / Procedures / Treatments   Labs (all labs ordered are listed, but only abnormal results are displayed) Labs Reviewed  BASIC METABOLIC PANEL - Abnormal; Notable for the following components:      Result Value   Glucose, Bld 112 (*)    All other components within normal limits  TROPONIN I (HIGH SENSITIVITY) - Abnormal; Notable for the following components:   Troponin I (High Sensitivity) 42 (*)    All other components within normal limits  TROPONIN I (HIGH SENSITIVITY) - Abnormal; Notable for the following components:   Troponin I (High Sensitivity) 127 (*)     All other components within normal limits  SARS CORONAVIRUS 2 (TAT 6-24 HRS)  CBC WITH DIFFERENTIAL/PLATELET  BASIC METABOLIC PANEL  LIPID PANEL  CBC  PROTIME-INR  HEPARIN LEVEL (UNFRACTIONATED)  TROPONIN I (HIGH SENSITIVITY)    EKG EKG Interpretation  Date/Time:  Saturday June 30 2021 15:16:12 EDT Ventricular Rate:  78 PR Interval:  178 QRS Duration: 94 QT Interval:  392 QTC Calculation: 447 R Axis:   38 Text Interpretation: Sinus rhythm No significant change since last tracing Confirmed by Wandra Arthurs [phone]) on 06/30/2021 3:45:29 PM  Radiology DG Chest Portable 1 View  Result Date: 06/30/2021 CLINICAL DATA:  Shortness of breath. EXAM: PORTABLE CHEST 1 VIEW COMPARISON:  07/07/2014 FINDINGS: The cardiomediastinal silhouette is unremarkable. There is no evidence of focal airspace disease, pulmonary edema, suspicious pulmonary nodule/mass, pleural effusion, or pneumothorax. No acute bony abnormalities are identified. IMPRESSION: No active disease. Electronically Signed   By: Margarette Canada M.D.   On: 06/30/2021 16:30    Procedures Procedures   Medications Ordered in ED Medications  aspirin EC tablet 81 mg (has no administration in time range)  nitroGLYCERIN (NITROSTAT) SL tablet 0.4 mg (has no administration in time range)  ondansetron (ZOFRAN) injection 4 mg (has no administration in time range)  0.9 %  sodium chloride infusion ( Intravenous New Bag/Given 07/01/21 0111)  metoprolol tartrate (LOPRESSOR) tablet 12.5 mg (12.5 mg Oral Given 07/01/21 0046)  atorvastatin (LIPITOR) tablet 80 mg (has no administration in time range)  acetaminophen (TYLENOL) tablet 1,000 mg (has no administration in time range)  polyvinyl alcohol (LIQUIFILM TEARS) 1.4 % ophthalmic solution 1 drop (has no administration in time range)  cholecalciferol (VITAMIN D3) tablet 1,000 Units (has no administration in time range)  vitamin B-12 (CYANOCOBALAMIN) tablet 1,000 mcg (has no administration in time range)   diltiazem (CARDIZEM) tablet 30 mg (has no administration in time range)  losartan (COZAAR) tablet 50 mg (has no administration in time range)  heparin ADULT infusion 100 units/mL (25000 units/269m) (800 Units/hr Intravenous  New Bag/Given 07/01/21 0113)  sodium chloride 0.9 % bolus 1,000 mL (0 mLs Intravenous Stopped 06/30/21 2032)  heparin bolus via infusion 3,000 Units (3,000 Units Intravenous Bolus from Bag 07/01/21 0111)    ED Course  I have reviewed the triage vital signs and the nursing notes.  Pertinent labs & imaging results that were available during my care of the patient were reviewed by me and considered in my medical decision making (see chart for details).    MDM Rules/Calculators/A&P                         Carmen Fernandez is a 75 y.o. female that is presenting for SVT with EMS that has resolved upon arrival. Patient was found to be in SVT that was self limited. EMS did not give any medications. She denies any symptoms currently. She complained of palpitations and chest pain while in SVT. CXR showed no acute cardiac or pulmonary abnormality. Labs notable for a troponin of 42 and 127. Cardiology was consulted and she will be admitted to cardiology service.   Patient states compliance and understanding of the plan. I explained labs and imaging to the patient. No further questions at this time from the patient.  Patient will be admitted to cardiology for further evaluation.   The plan for this patient was discussed with Dr. Darl Householder, who voiced agreement and who oversaw evaluation and treatment of this patient.   Final Clinical Impression(s) / ED Diagnoses Final diagnoses:  Palpitation  SVT (supraventricular tachycardia) [facility])    Rx / DC Orders ED Discharge Orders     None        Doretha Sou, MD 07/01/21 0126    Drenda Freeze, MD 07/01/21 [phone]

## 2021-06-30 NOTE — ED Triage Notes (Signed)
Pt bib ems from home c/o chest pain, sob, and dizziness that suddenly started at 14:00. Ems stated en route pt HR 210. Pt did vagal maneuvers which dropped HR 186. Pt received 500 ml normal saline. Pt denies cardiac hx.  HR 86 BP 150/92 Room Air 100%

## 2021-07-01 ENCOUNTER — Other Ambulatory Visit: Payer: Self-pay

## 2021-07-01 ENCOUNTER — Inpatient Hospital Stay (HOSPITAL_COMMUNITY): Payer: Medicare Other

## 2021-07-01 LAB — PROTIME-INR
INR: 1.1 (ref 0.8–1.2)
Prothrombin Time: 14 seconds (ref 11.4–15.2)

## 2021-07-01 LAB — BASIC METABOLIC PANEL
Anion gap: 7 (ref 5–15)
BUN: 8 mg/dL (ref 8–23)
CO2: 26 mmol/L (ref 22–32)
Calcium: 8.9 mg/dL (ref 8.9–10.3)
Chloride: 108 mmol/L (ref 98–111)
Creatinine, Ser: 0.79 mg/dL (ref 0.44–1.00)
GFR, Estimated: >60 mL/min (ref >60)
Glucose, Bld: 105 mg/dL — ABNORMAL HIGH (ref 70–99)
Potassium: 3.9 mmol/L (ref 3.5–5.1)
Sodium: 141 mmol/L (ref 135–145)

## 2021-07-01 LAB — CBC
HCT: 36 % (ref 36.0–46.0)
HCT: 37.6 % (ref 36.0–46.0)
Hemoglobin: 11.9 g/dL — ABNORMAL LOW (ref 12.0–15.0)
Hemoglobin: 12.2 g/dL (ref 12.0–15.0)
MCH: 29.9 pg (ref 26.0–34.0)
MCH: 29.8 pg (ref 26.0–34.0)
MCHC: 33.1 g/dL (ref 30.0–36.0)
MCHC: 32.4 g/dL (ref 30.0–36.0)
MCV: 90.5 fL (ref 80.0–100.0)
MCV: 91.9 fL (ref 80.0–100.0)
Platelets: 285 10*3/uL (ref 150–400)
Platelets: 298 10*3/uL (ref 150–400)
RBC: 3.98 MIL/uL (ref 3.87–5.11)
RBC: 4.09 MIL/uL (ref 3.87–5.11)
RDW: 13.3 % (ref 11.5–15.5)
RDW: 13.3 % (ref 11.5–15.5)
WBC: 6.9 10*3/uL (ref 4.0–10.5)
WBC: 6.9 10*3/uL (ref 4.0–10.5)
nRBC: 0 % (ref 0.0–0.2)
nRBC: 0 % (ref 0.0–0.2)

## 2021-07-01 LAB — ECHOCARDIOGRAM COMPLETE
AR max vel: 1.77 cm2
AV Area VTI: 2.1 cm2
AV Area mean vel: 2.1 cm2
AV Mean grad: 4 mmHg
AV Peak grad: 7 mmHg
Ao pk vel: 1.32 m/s
Area-P 1/2: 4.06 cm2
Height: 66 in
S' Lateral: 3.1 cm
Weight: 2377.6 oz

## 2021-07-01 LAB — LIPID PANEL
Cholesterol: 144 mg/dL (ref 0–200)
HDL: 40 mg/dL — ABNORMAL LOW (ref >40)
LDL Cholesterol: 89 mg/dL (ref 0–99)
Total CHOL/HDL Ratio: 3.6 RATIO
Triglycerides: 76 mg/dL (ref <150)
VLDL: 15 mg/dL (ref 0–40)

## 2021-07-01 LAB — TROPONIN I (HIGH SENSITIVITY): Troponin I (High Sensitivity): 70 ng/L — ABNORMAL HIGH (ref <18)

## 2021-07-01 LAB — HEMOGLOBIN A1C
Hgb A1c MFr Bld: 5.9 % — ABNORMAL HIGH (ref 4.8–5.6)
Mean Plasma Glucose: 122.63 mg/dL

## 2021-07-01 LAB — HEPARIN LEVEL (UNFRACTIONATED)
Heparin Unfractionated: 0.46 IU/mL (ref 0.30–0.70)
Heparin Unfractionated: 0.18 IU/mL — ABNORMAL LOW (ref 0.30–0.70)

## 2021-07-01 LAB — SARS CORONAVIRUS 2 (TAT 6-24 HRS): SARS Coronavirus 2: NEGATIVE

## 2021-07-01 MED ORDER — HEPARIN (PORCINE) 25000 UT/250ML-% IV SOLN
1000.0000 [IU]/h | INTRAVENOUS | Status: DC
  Administered 2021-07-01: 1000 [IU]/h via INTRAVENOUS
  Administered 2021-07-01: 800 [IU]/h via INTRAVENOUS
  Filled 2021-07-01 (×2): qty 250

## 2021-07-01 MED ORDER — DILTIAZEM HCL 60 MG PO TABS
30.0000 mg | ORAL_TABLET | Freq: Four times a day (QID) | ORAL | Status: DC
  Administered 2021-07-01 – 2021-07-03 (×5): 30 mg via ORAL
  Filled 2021-07-01 (×7): qty 1

## 2021-07-01 MED ORDER — LOSARTAN POTASSIUM 50 MG PO TABS
50.0000 mg | ORAL_TABLET | Freq: Every day | ORAL | Status: DC
  Administered 2021-07-01 – 2021-07-03 (×3): 50 mg via ORAL
  Filled 2021-07-01 (×3): qty 1

## 2021-07-01 MED ORDER — HEPARIN BOLUS VIA INFUSION
3000.0000 [IU] | Freq: Once | INTRAVENOUS | Status: AC
  Administered 2021-07-01: 3000 [IU] via INTRAVENOUS
  Filled 2021-07-01: qty 3000

## 2021-07-01 MED ORDER — HEPARIN BOLUS VIA INFUSION
2000.0000 [IU] | Freq: Once | INTRAVENOUS | Status: AC
  Administered 2021-07-01: 2000 [IU] via INTRAVENOUS
  Filled 2021-07-01: qty 2000

## 2021-07-01 NOTE — Progress Notes (Signed)
ANTICOAGULATION CONSULT NOTE - Follow-up   Pharmacy Consult for heparin Indication: chest pain/ACS  Allergies  Allergen Reactions   Elemental Sulfur    Penicillins     REACTION: as a child    Patient Measurements: Height: '5\' 6"'$  (167.6 cm) Weight: 67.4 kg (148 lb 9.6 oz) IBW/kg (Calculated) : 59.3 Heparin Dosing Weight: 66.2 kg  Vital Signs: Temp: 98.2 F (36.8 C) (08/07 1504) Temp Source: Oral (08/07 1504) BP: 123/58 (08/07 1504) Pulse Rate: 71 (08/07 1034)  Labs: Recent Labs    06/30/21 1621 06/30/21 2017 07/01/21 0514 07/01/21 0630 07/01/21 0931 07/01/21 1408  HGB 13.0  --  11.9*  --  12.2  --   HCT 39.0  --  36.0  --  37.6  --   PLT 314  --  285  --  298  --   LABPROT  --   --  14.0  --   --   --   INR  --   --  1.1  --   --   --   HEPARINUNFRC  --   --   --  0.46  --  0.18*  CREATININE 0.82  --  0.79  --   --   --   TROPONINIHS 42* 127* 70*  --   --   --      Estimated Creatinine Clearance: 56.9 mL/min (by C-G formula based on SCr of 0.79 mg/dL).   Medical History: Past Medical History:  Diagnosis Date   FECAL OCCULT BLOOD    HEMOCCULT POSITIVE STOOL    HYPERTENSION    NONSPEC ABNORM PAP CERV UNSATISFACTORY CYTOLOGY     Medications:  See medication history  Assessment: Ms. Schartner is a 75 YO female who presented to the ED on 8/6 with complaints of chest pain and palpitations. Cardiac cath is planned for this week. Pharmacy asked to dose heparin.  Confirmatory heparin level 0.18 and subtherapeutic (earlier therapeutic dose likely to bolus effect). Hgb/H and Plt are stable and WNL. No signs of bleeding or IV site issues per RN.  Goals: Heparin level 0.3-0.7 units/ml Monitor platelets by anticoagulation protocol: Yes   Plan:  IV heparin 2000 units bolus x 1 Increase IV heparin gtt to 1000 units/h Confirmatory 8h heparin level Daily heparin level, CBC Monitor for signs/symptoms of bleeding  Thank you for involving pharmacy in this patient's  care.  Elita Quick, PharmD PGY1 Ambulatory Care Pharmacy Resident 07/01/2021 3:22 PM  **Pharmacist phone directory can be found on [email or web address] listed under [place]**

## 2021-07-01 NOTE — Progress Notes (Signed)
ANTICOAGULATION CONSULT NOTE - Initial Consult  Pharmacy Consult for heparin Indication: chest pain/ACS  Allergies  Allergen Reactions   Elemental Sulfur    Penicillins     REACTION: as a child    Patient Measurements: Height: '5\' 6"'$  (167.6 cm) Weight: 66.2 kg (146 lb) IBW/kg (Calculated) : 59.3 Heparin Dosing Weight: 66.2 kg  Vital Signs: Temp: 97.4 F (36.3 C) (08/06 1512) BP: 155/106 (08/06 2215) Pulse Rate: 61 (08/06 2215)  Labs: Recent Labs    06/30/21 1621 06/30/21 2017  HGB 13.0  --   HCT 39.0  --   PLT 314  --   CREATININE 0.82  --   TROPONINIHS 42* 127*    Estimated Creatinine Clearance: 55.5 mL/min (by C-G formula based on SCr of 0.82 mg/dL).   Medical History: Past Medical History:  Diagnosis Date   FECAL OCCULT BLOOD    HEMOCCULT POSITIVE STOOL    HYPERTENSION    NONSPEC ABNORM PAP CERV UNSATISFACTORY CYTOLOGY     Medications:  See medication history  Assessment: 75 yo lady to start heparin for CP.  She was not on anticoagulation PTA.  Hg 13, PTLC 314 Goal of Therapy:  Heparin level 0.3-0.7 units/ml Monitor platelets by anticoagulation protocol: Yes   Plan:  Heparin 3000 unit bolus and drip at 800 units/hr Check heparin level in 6-8 hours Daily HL and CBC Monitor for bleeding complications  Carmen Fernandez 07/01/2021,12:17 AM

## 2021-07-01 NOTE — Progress Notes (Signed)
ANTICOAGULATION CONSULT NOTE - Follow-up   Pharmacy Consult for heparin Indication: chest pain/ACS  Allergies  Allergen Reactions   Elemental Sulfur    Penicillins     REACTION: as a child    Patient Measurements: Height: '5\' 6"'$  (167.6 cm) Weight: 67.4 kg (148 lb 9.6 oz) IBW/kg (Calculated) : 59.3 Heparin Dosing Weight: 66.2 kg  Vital Signs: Temp: 98.3 F (36.8 C) (08/07 0424) Temp Source: Oral (08/07 0424) BP: 157/83 (08/07 0424) Pulse Rate: 69 (08/07 0424)  Labs: Recent Labs    06/30/21 1621 06/30/21 2017 07/01/21 0514 07/01/21 0630  HGB 13.0  --  11.9*  --   HCT 39.0  --  36.0  --   PLT 314  --  285  --   LABPROT  --   --  14.0  --   INR  --   --  1.1  --   HEPARINUNFRC  --   --   --  0.46  CREATININE 0.82  --  0.79  --   TROPONINIHS 42* 127* 70*  --      Estimated Creatinine Clearance: 56.9 mL/min (by C-G formula based on SCr of 0.79 mg/dL).   Medical History: Past Medical History:  Diagnosis Date   FECAL OCCULT BLOOD    HEMOCCULT POSITIVE STOOL    HYPERTENSION    NONSPEC ABNORM PAP CERV UNSATISFACTORY CYTOLOGY     Medications:  See medication history  Assessment: Ms. Liuzza is a 75 YO female who presented to the ED on 8/6 with complaints of chest pain and palpitations. Cardiac cath is planned for this week. Pharmacy asked to dose heparin.  Heparin level 0.46 today and therapeutic. Hgb/H and Plt are stable and WNL. No signs of bleeding or IV site issues per RN.  Goals: Heparin level 0.3-0.7 units/ml Monitor platelets by anticoagulation protocol: Yes   Plan:  Continue IV heparin gtt @ 800 units/h Confirmatory 8h heparin level Daily heparin level, CBC Monitor for signs/symptoms of bleeding  Thank you for involving pharmacy in this patient's care.  Elita Quick, PharmD PGY1 Ambulatory Care Pharmacy Resident 07/01/2021 7:45 AM  **Pharmacist phone directory can be found on [email or web address] listed under [place]**

## 2021-07-01 NOTE — Progress Notes (Signed)
  Echocardiogram 2D Echocardiogram has been performed.  Carmen Fernandez 07/01/2021, 6:06 PM

## 2021-07-01 NOTE — H&P (View-Only) (Signed)
Ref: [name], PA   Subjective:  VS stable.  No chest pain. Troponin levels are trending down.  Objective:  Vital Signs in the last 24 hours: Temp:  [97.4 F (36.3 C)-98.3 F (36.8 C)] 97.7 F (36.5 C) (08/07 0749) Pulse Rate:  [55-83] 69 (08/07 0424) Cardiac Rhythm: Sinus bradycardia (08/07 0830) Resp:  [12-35] 18 (08/07 0749) BP: (138-169)/(74-114) 159/79 (08/07 0749) SpO2:  [97 %-100 %] 99 % (08/07 0749) Weight:  [66.2 kg-67.4 kg] 67.4 kg (08/07 0424)  Physical Exam: BP Readings from Last 1 Encounters:  07/01/21 (!) 159/79     Wt Readings from Last 1 Encounters:  07/01/21 67.4 kg    Weight change:  Body mass index is 23.98 kg/m. HEENT: [place]/AT, Eyes-Blue Conjunctiva-Pink, Sclera-Non-icteric Neck: No JVD, No bruit, Trachea midline. Lungs:  Clear, Bilateral. Cardiac:  Regular rhythm, normal S1 and S2, no S3. II/VI systolic murmur. Abdomen:  Soft, non-tender. BS present. Extremities:  No edema present. No cyanosis. No clubbing. CNS: AxOx3, Cranial nerves grossly intact, moves all 4 extremities.  Skin: Warm and dry.   Intake/Output from previous day: 08/06 0701 - 08/07 0700 In: 1000 [IV Piggyback:1000] Out: -     Lab Results: BMET    Component Value Date/Time   NA 141 07/01/2021 0514   NA 141 06/30/2021 1621   NA 134 (L) 01/17/2016 0909   K 3.9 07/01/2021 0514   K 3.5 06/30/2021 1621   K 4.2 01/17/2016 0909   CL 108 07/01/2021 0514   CL 110 06/30/2021 1621   CL 98 01/17/2016 0909   CO2 26 07/01/2021 0514   CO2 25 06/30/2021 1621   CO2 24 01/17/2016 0909   GLUCOSE 105 (H) 07/01/2021 0514   GLUCOSE 112 (H) 06/30/2021 1621   GLUCOSE 102 (H) 01/17/2016 0909   BUN 8 07/01/2021 0514   BUN 14 06/30/2021 1621   BUN 13 01/17/2016 0909   CREATININE 0.79 07/01/2021 0514   CREATININE 0.82 06/30/2021 1621   CREATININE 0.75 01/17/2016 0909   CREATININE 0.75 06/30/2015 1029   CREATININE 0.79 07/07/2014 1003   CALCIUM 8.9 07/01/2021 0514   CALCIUM 9.2  06/30/2021 1621   CALCIUM 9.5 01/17/2016 0909   GFRNONAA >60 07/01/2021 0514   GFRNONAA >60 06/30/2021 1621   GFRNONAA 81 01/17/2016 0909   GFRAA >89 01/17/2016 0909   CBC    Component Value Date/Time   WBC 6.9 07/01/2021 0514   RBC 3.98 07/01/2021 0514   HGB 11.9 (L) 07/01/2021 0514   HCT 36.0 07/01/2021 0514   PLT 285 07/01/2021 0514   MCV 90.5 07/01/2021 0514   MCV 90.2 07/07/2014 1005   MCH 29.9 07/01/2021 0514   MCHC 33.1 07/01/2021 0514   RDW 13.3 07/01/2021 0514   LYMPHSABS 1.8 06/30/2021 1621   MONOABS 0.5 06/30/2021 1621   EOSABS 0.1 06/30/2021 1621   BASOSABS 0.1 06/30/2021 1621   HEPATIC Function Panel No results for input(s): PROT in the last 8760 hours.  Invalid input(s):  ALBUMIN,  AST,  ALT,  ALKPHOS,  BILIDIR,  IBILI HEMOGLOBIN A1C No components found for: HGA1C,  MPG CARDIAC ENZYMES No results found for: CKTOTAL, CKMB, CKMBINDEX, TROPONINI BNP No results for input(s): PROBNP in the last 8760 hours. TSH No results for input(s): TSH in the last 8760 hours. CHOLESTEROL Recent Labs    07/01/21 0514  CHOL 144    Scheduled Meds:  aspirin EC  81 mg Oral Daily   atorvastatin  80 mg Oral Daily   cholecalciferol  1,000 Units Oral Daily   diltiazem  30 mg Oral QID   losartan  50 mg Oral Daily   metoprolol tartrate  12.5 mg Oral BID   vitamin B-12  1,000 mcg Oral Daily   Continuous Infusions:  sodium chloride 50 mL/hr at 07/01/21 0111   heparin 800 Units/hr (07/01/21 0326)   PRN Meds:.acetaminophen, nitroGLYCERIN, ondansetron (ZOFRAN) IV, polyvinyl alcohol  Assessment/Plan:  Acute coronary syndrome Hypertension Paroxysmal SVT  Plan: Awaiting echocardiogram. Cardiac cath in AM.   LOS: 1 day   Time spent including chart review, lab review, examination, discussion with patient/Nurse : 30 min   Dixie Dials  MD  07/01/2021, 10:03 AM

## 2021-07-01 NOTE — ED Notes (Signed)
Pt ambulated with steady gait to restroom ?

## 2021-07-01 NOTE — Progress Notes (Signed)
Ref: [name], PA   Subjective:  VS stable.  No chest pain. Troponin levels are trending down.  Objective:  Vital Signs in the last 24 hours: Temp:  [97.4 F (36.3 C)-98.3 F (36.8 C)] 97.7 F (36.5 C) (08/07 0749) Pulse Rate:  [55-83] 69 (08/07 0424) Cardiac Rhythm: Sinus bradycardia (08/07 0830) Resp:  [12-35] 18 (08/07 0749) BP: (138-169)/(74-114) 159/79 (08/07 0749) SpO2:  [97 %-100 %] 99 % (08/07 0749) Weight:  [66.2 kg-67.4 kg] 67.4 kg (08/07 0424)  Physical Exam: BP Readings from Last 1 Encounters:  07/01/21 (!) 159/79     Wt Readings from Last 1 Encounters:  07/01/21 67.4 kg    Weight change:  Body mass index is 23.98 kg/m. HEENT: [place]/AT, Eyes-Blue Conjunctiva-Pink, Sclera-Non-icteric Neck: No JVD, No bruit, Trachea midline. Lungs:  Clear, Bilateral. Cardiac:  Regular rhythm, normal S1 and S2, no S3. II/VI systolic murmur. Abdomen:  Soft, non-tender. BS present. Extremities:  No edema present. No cyanosis. No clubbing. CNS: AxOx3, Cranial nerves grossly intact, moves all 4 extremities.  Skin: Warm and dry.   Intake/Output from previous day: 08/06 0701 - 08/07 0700 In: 1000 [IV Piggyback:1000] Out: -     Lab Results: BMET    Component Value Date/Time   NA 141 07/01/2021 0514   NA 141 06/30/2021 1621   NA 134 (L) 01/17/2016 0909   K 3.9 07/01/2021 0514   K 3.5 06/30/2021 1621   K 4.2 01/17/2016 0909   CL 108 07/01/2021 0514   CL 110 06/30/2021 1621   CL 98 01/17/2016 0909   CO2 26 07/01/2021 0514   CO2 25 06/30/2021 1621   CO2 24 01/17/2016 0909   GLUCOSE 105 (H) 07/01/2021 0514   GLUCOSE 112 (H) 06/30/2021 1621   GLUCOSE 102 (H) 01/17/2016 0909   BUN 8 07/01/2021 0514   BUN 14 06/30/2021 1621   BUN 13 01/17/2016 0909   CREATININE 0.79 07/01/2021 0514   CREATININE 0.82 06/30/2021 1621   CREATININE 0.75 01/17/2016 0909   CREATININE 0.75 06/30/2015 1029   CREATININE 0.79 07/07/2014 1003   CALCIUM 8.9 07/01/2021 0514   CALCIUM 9.2  06/30/2021 1621   CALCIUM 9.5 01/17/2016 0909   GFRNONAA >60 07/01/2021 0514   GFRNONAA >60 06/30/2021 1621   GFRNONAA 81 01/17/2016 0909   GFRAA >89 01/17/2016 0909   CBC    Component Value Date/Time   WBC 6.9 07/01/2021 0514   RBC 3.98 07/01/2021 0514   HGB 11.9 (L) 07/01/2021 0514   HCT 36.0 07/01/2021 0514   PLT 285 07/01/2021 0514   MCV 90.5 07/01/2021 0514   MCV 90.2 07/07/2014 1005   MCH 29.9 07/01/2021 0514   MCHC 33.1 07/01/2021 0514   RDW 13.3 07/01/2021 0514   LYMPHSABS 1.8 06/30/2021 1621   MONOABS 0.5 06/30/2021 1621   EOSABS 0.1 06/30/2021 1621   BASOSABS 0.1 06/30/2021 1621   HEPATIC Function Panel No results for input(s): PROT in the last 8760 hours.  Invalid input(s):  ALBUMIN,  AST,  ALT,  ALKPHOS,  BILIDIR,  IBILI HEMOGLOBIN A1C No components found for: HGA1C,  MPG CARDIAC ENZYMES No results found for: CKTOTAL, CKMB, CKMBINDEX, TROPONINI BNP No results for input(s): PROBNP in the last 8760 hours. TSH No results for input(s): TSH in the last 8760 hours. CHOLESTEROL Recent Labs    07/01/21 0514  CHOL 144    Scheduled Meds:  aspirin EC  81 mg Oral Daily   atorvastatin  80 mg Oral Daily   cholecalciferol  1,000 Units Oral Daily   diltiazem  30 mg Oral QID   losartan  50 mg Oral Daily   metoprolol tartrate  12.5 mg Oral BID   vitamin B-12  1,000 mcg Oral Daily   Continuous Infusions:  sodium chloride 50 mL/hr at 07/01/21 0111   heparin 800 Units/hr (07/01/21 0326)   PRN Meds:.acetaminophen, nitroGLYCERIN, ondansetron (ZOFRAN) IV, polyvinyl alcohol  Assessment/Plan:  Acute coronary syndrome Hypertension Paroxysmal SVT  Plan: Awaiting echocardiogram. Cardiac cath in AM.   LOS: 1 day   Time spent including chart review, lab review, examination, discussion with patient/Nurse : 30 min   Dixie Dials  MD  07/01/2021, 10:03 AM

## 2021-07-01 NOTE — ED Notes (Signed)
Attempted report x1. 

## 2021-07-02 ENCOUNTER — Encounter (HOSPITAL_COMMUNITY): Payer: Self-pay | Admitting: Cardiovascular Disease

## 2021-07-02 ENCOUNTER — Inpatient Hospital Stay (HOSPITAL_COMMUNITY)
Admission: EM | Disposition: A | Discharge status: Home / Self Care | Payer: Self-pay | Source: Home / Self Care | Attending: Cardiovascular Disease

## 2021-07-02 HISTORY — PX: LEFT HEART CATH AND CORONARY ANGIOGRAPHY: CATH118249

## 2021-07-02 LAB — BASIC METABOLIC PANEL
Anion gap: 5 (ref 5–15)
BUN: 11 mg/dL (ref 8–23)
CO2: 26 mmol/L (ref 22–32)
Calcium: 8.7 mg/dL — ABNORMAL LOW (ref 8.9–10.3)
Chloride: 108 mmol/L (ref 98–111)
Creatinine, Ser: 0.71 mg/dL (ref 0.44–1.00)
GFR, Estimated: >60 mL/min (ref >60)
Glucose, Bld: 109 mg/dL — ABNORMAL HIGH (ref 70–99)
Potassium: 4 mmol/L (ref 3.5–5.1)
Sodium: 139 mmol/L (ref 135–145)

## 2021-07-02 LAB — POCT ACTIVATED CLOTTING TIME: Activated Clotting Time: 138 seconds

## 2021-07-02 LAB — CARDIAC CATHETERIZATION: Cath EF Quantitative: 60 %

## 2021-07-02 LAB — HEPARIN LEVEL (UNFRACTIONATED): Heparin Unfractionated: 0.61 IU/mL (ref 0.30–0.70)

## 2021-07-02 SURGERY — LEFT HEART CATH AND CORONARY ANGIOGRAPHY
Anesthesia: LOCAL

## 2021-07-02 MED ORDER — HEPARIN (PORCINE) IN NACL 1000-0.9 UT/500ML-% IV SOLN
INTRAVENOUS | Status: AC
  Filled 2021-07-02: qty 1000

## 2021-07-02 MED ORDER — VERAPAMIL HCL 2.5 MG/ML IV SOLN: INTRAVENOUS | Status: DC | PRN

## 2021-07-02 MED ORDER — LIDOCAINE HCL (PF) 1 % IJ SOLN
INTRAMUSCULAR | Status: AC
  Filled 2021-07-02: qty 30

## 2021-07-02 MED ORDER — MIDAZOLAM HCL 2 MG/2ML IJ SOLN
INTRAMUSCULAR | Status: DC | PRN
  Administered 2021-07-02 (×2): 1 mg via INTRAVENOUS

## 2021-07-02 MED ORDER — IOHEXOL 350 MG/ML SOLN
INTRAVENOUS | Status: DC | PRN
  Administered 2021-07-02: 30 mL

## 2021-07-02 MED ORDER — VERAPAMIL HCL 2.5 MG/ML IV SOLN
INTRAVENOUS | Status: AC
  Filled 2021-07-02: qty 2

## 2021-07-02 MED ORDER — HEPARIN (PORCINE) IN NACL 1000-0.9 UT/500ML-% IV SOLN
INTRAVENOUS | Status: DC | PRN
  Administered 2021-07-02 (×2): 500 mL

## 2021-07-02 MED ORDER — MIDAZOLAM HCL 2 MG/2ML IJ SOLN
INTRAMUSCULAR | Status: AC
  Filled 2021-07-02: qty 2

## 2021-07-02 MED ORDER — FENTANYL CITRATE (PF) 100 MCG/2ML IJ SOLN
INTRAMUSCULAR | Status: AC
  Filled 2021-07-02: qty 2

## 2021-07-02 MED ORDER — SODIUM CHLORIDE 0.9% FLUSH: 3.0000 mL | Freq: Two times a day (BID) | INTRAVENOUS | Status: DC

## 2021-07-02 MED ORDER — HYDRALAZINE HCL 20 MG/ML IJ SOLN: 10.0000 mg | INTRAMUSCULAR | Status: AC | PRN

## 2021-07-02 MED ORDER — SODIUM CHLORIDE 0.9% FLUSH: 3.0000 mL | INTRAVENOUS | Status: DC | PRN

## 2021-07-02 MED ORDER — FENTANYL CITRATE (PF) 100 MCG/2ML IJ SOLN
INTRAMUSCULAR | Status: DC | PRN
  Administered 2021-07-02 (×2): 25 ug via INTRAVENOUS

## 2021-07-02 MED ORDER — SODIUM CHLORIDE 0.9 % IV SOLN: 250.0000 mL | INTRAVENOUS | Status: DC | PRN

## 2021-07-02 MED ORDER — LABETALOL HCL 5 MG/ML IV SOLN: 10.0000 mg | INTRAVENOUS | Status: AC | PRN

## 2021-07-02 MED ORDER — LIDOCAINE HCL (PF) 1 % IJ SOLN
INTRAMUSCULAR | Status: DC | PRN
  Administered 2021-07-02: 12 mL

## 2021-07-02 MED ORDER — SODIUM CHLORIDE 0.9 % IV SOLN: INTRAVENOUS | Status: AC

## 2021-07-02 SURGICAL SUPPLY — 10 items
CATH INFINITI 5FR MULTPACK ANG (CATHETERS) ×1 IMPLANT
DEVICE RAD COMP TR BAND LRG (VASCULAR PRODUCTS) ×1 IMPLANT
GLIDESHEATH SLEND SS 6F .021 (SHEATH) ×1 IMPLANT
KIT HEART LEFT (KITS) ×2 IMPLANT
PACK CARDIAC CATHETERIZATION (CUSTOM PROCEDURE TRAY) ×2 IMPLANT
SHEATH PINNACLE 5F 10CM (SHEATH) ×1 IMPLANT
SHEATH PROBE COVER 6X72 (BAG) ×1 IMPLANT
TRANSDUCER W/STOPCOCK (MISCELLANEOUS) ×2 IMPLANT
TUBING CIL FLEX 10 FLL-RA (TUBING) ×1 IMPLANT
WIRE EMERALD 3MM-J .035X150CM (WIRE) ×1 IMPLANT

## 2021-07-02 NOTE — Interval H&P Note (Signed)
History and Physical Interval Note:  07/02/2021 10:35 AM  Carmen Fernandez  has presented today for surgery, with the diagnosis of chest pain.  The various methods of treatment have been discussed with the patient and family. After consideration of risks, benefits and other options for treatment, the patient has consented to  Procedure(s): LEFT HEART CATH AND CORONARY ANGIOGRAPHY (N/A) as a surgical intervention.  The patient's history has been reviewed, patient examined, no change in status, stable for surgery.  I have reviewed the patient's chart and labs.  Questions were answered to the patient's satisfaction.     [name]

## 2021-07-02 NOTE — Progress Notes (Signed)
ANTICOAGULATION CONSULT NOTE - Follow Up Consult  Pharmacy Consult for heparin Indication: chest pain/ACS  Labs: Recent Labs    06/30/21 1621 06/30/21 2017 07/01/21 0514 07/01/21 0630 07/01/21 0931 07/01/21 1408 07/02/21 0034  HGB 13.0  --  11.9*  --  12.2  --   --   HCT 39.0  --  36.0  --  37.6  --   --   PLT 314  --  285  --  298  --   --   LABPROT  --   --  14.0  --   --   --   --   INR  --   --  1.1  --   --   --   --   HEPARINUNFRC  --   --   --  0.46  --  0.18* 0.61  CREATININE 0.82  --  0.79  --   --   --  0.71  TROPONINIHS 42* 127* 70*  --   --   --   --     Assessment/Plan:  75yo female therapeutic on heparin after rate change. Will continue infusion at current rate of 1000 units/hr and confirm stable with additional level.   Wynona Neat, PharmD, BCPS  07/02/2021,1:53 AM

## 2021-07-02 NOTE — Plan of Care (Signed)
  Problem: Cardiovascular: Goal: Ability to achieve and maintain adequate cardiovascular perfusion will improve Outcome: Progressing   Problem: Education: Goal: Understanding of CV disease, CV risk reduction, and recovery process will improve Outcome: Progressing   Problem: Safety: Goal: Ability to remain free from injury will improve Outcome: Progressing   Problem: Pain Managment: Goal: General experience of comfort will improve Outcome: Progressing   Problem: Clinical Measurements: Goal: Ability to maintain clinical measurements within normal limits will improve Outcome: Progressing Goal: Cardiovascular complication will be avoided Outcome: Progressing   Problem: Health Behavior/Discharge Planning: Goal: Ability to manage health-related needs will improve Outcome: Progressing

## 2021-07-02 NOTE — Progress Notes (Signed)
Site area: rt groin fa sheath Site Prior to Removal:  Level 0 Pressure Applied For: 20 minutes Manual:   yes Patient Status During Pull:  stable Post Pull Site:  Level 0 Post Pull Instructions Given:  yes Post Pull Pulses Present: rt dp palpable Dressing Applied:  gauze and tegaderm Bedrest begins @ 1230 Comments:

## 2021-07-03 LAB — BASIC METABOLIC PANEL
Anion gap: 8 (ref 5–15)
BUN: 11 mg/dL (ref 8–23)
CO2: 25 mmol/L (ref 22–32)
Calcium: 8.6 mg/dL — ABNORMAL LOW (ref 8.9–10.3)
Chloride: 103 mmol/L (ref 98–111)
Creatinine, Ser: 0.81 mg/dL (ref 0.44–1.00)
GFR, Estimated: >60 mL/min (ref >60)
Glucose, Bld: 105 mg/dL — ABNORMAL HIGH (ref 70–99)
Potassium: 3.8 mmol/L (ref 3.5–5.1)
Sodium: 136 mmol/L (ref 135–145)

## 2021-07-03 LAB — CBC
HCT: 35.9 % — ABNORMAL LOW (ref 36.0–46.0)
Hemoglobin: 11.8 g/dL — ABNORMAL LOW (ref 12.0–15.0)
MCH: 30 pg (ref 26.0–34.0)
MCHC: 32.9 g/dL (ref 30.0–36.0)
MCV: 91.3 fL (ref 80.0–100.0)
Platelets: 278 10*3/uL (ref 150–400)
RBC: 3.93 MIL/uL (ref 3.87–5.11)
RDW: 13.3 % (ref 11.5–15.5)
WBC: 7.1 10*3/uL (ref 4.0–10.5)
nRBC: 0 % (ref 0.0–0.2)

## 2021-07-03 MED ORDER — ISOSORBIDE MONONITRATE ER 30 MG PO TB24
15.0000 mg | ORAL_TABLET | Freq: Every day | ORAL | Status: DC
  Administered 2021-07-03: 15 mg via ORAL
  Filled 2021-07-03: qty 1

## 2021-07-03 MED ORDER — ATORVASTATIN CALCIUM 80 MG PO TABS: 80.0000 mg | ORAL_TABLET | Freq: Every day | ORAL | 3 refills | Status: DC

## 2021-07-03 MED ORDER — CLOPIDOGREL BISULFATE 75 MG PO TABS
75.0000 mg | ORAL_TABLET | Freq: Every day | ORAL | Status: DC
  Administered 2021-07-03: 75 mg via ORAL
  Filled 2021-07-03: qty 1

## 2021-07-03 MED ORDER — ASPIRIN 81 MG PO TBEC: 81.0000 mg | DELAYED_RELEASE_TABLET | Freq: Every day | ORAL | 11 refills | Status: AC

## 2021-07-03 MED ORDER — LOSARTAN POTASSIUM 50 MG PO TABS: 50.0000 mg | ORAL_TABLET | Freq: Every day | ORAL | 3 refills | Status: DC

## 2021-07-03 MED ORDER — ISOSORBIDE MONONITRATE ER 30 MG PO TB24: 15.0000 mg | ORAL_TABLET | Freq: Every day | ORAL | 3 refills | Status: DC

## 2021-07-03 MED ORDER — METOPROLOL TARTRATE 25 MG PO TABS: 12.5000 mg | ORAL_TABLET | Freq: Two times a day (BID) | ORAL | 3 refills | Status: DC

## 2021-07-03 MED ORDER — CLOPIDOGREL BISULFATE 75 MG PO TABS: 75.0000 mg | ORAL_TABLET | Freq: Every day | ORAL | 3 refills | Status: DC

## 2021-07-03 MED ORDER — NITROGLYCERIN 0.4 MG SL SUBL: 0.4000 mg | SUBLINGUAL_TABLET | SUBLINGUAL | 1 refills | Status: AC | PRN

## 2021-07-03 MED FILL — Verapamil HCl IV Soln 2.5 MG/ML: INTRAVENOUS | Qty: 2 | Status: AC

## 2021-07-03 NOTE — Plan of Care (Signed)

## 2021-07-03 NOTE — Care Management Important Message (Signed)
Important Message  Patient Details  Name: Carmen Fernandez MRN: [id number] Date of Birth: Mar 29, 1946   Medicare Important Message Given:  Yes     Shelda Altes 07/03/2021, 10:17 AM

## 2021-07-03 NOTE — Plan of Care (Signed)
Problem: Education: Goal: Knowledge of General Education information will improve Description: Including pain rating scale, medication(s)/side effects and non-pharmacologic comfort measures 07/03/2021 1033 by Shelton Silvas, RN Outcome: Adequate for Discharge 07/03/2021 1032 by Shelton Silvas, RN Outcome: Progressing   Problem: Health Behavior/Discharge Planning: Goal: Ability to manage health-related needs will improve 07/03/2021 1033 by Shelton Silvas, RN Outcome: Adequate for Discharge 07/03/2021 1032 by Shelton Silvas, RN Outcome: Progressing   Problem: Clinical Measurements: Goal: Ability to maintain clinical measurements within normal limits will improve 07/03/2021 1033 by Shelton Silvas, RN Outcome: Adequate for Discharge 07/03/2021 1032 by Shelton Silvas, RN Outcome: Progressing Goal: Will remain free from infection 07/03/2021 1033 by Shelton Silvas, RN Outcome: Adequate for Discharge 07/03/2021 1032 by Shelton Silvas, RN Outcome: Progressing Goal: Diagnostic test results will improve 07/03/2021 1033 by Shelton Silvas, RN Outcome: Adequate for Discharge 07/03/2021 1032 by Shelton Silvas, RN Outcome: Progressing Goal: Respiratory complications will improve 07/03/2021 1033 by Shelton Silvas, RN Outcome: Adequate for Discharge 07/03/2021 1032 by Shelton Silvas, RN Outcome: Progressing Goal: Cardiovascular complication will be avoided 07/03/2021 1033 by Shelton Silvas, RN Outcome: Adequate for Discharge 07/03/2021 1032 by Shelton Silvas, RN Outcome: Progressing   Problem: Activity: Goal: Risk for activity intolerance will decrease 07/03/2021 1033 by Shelton Silvas, RN Outcome: Adequate for Discharge 07/03/2021 1032 by Shelton Silvas, RN Outcome: Progressing   Problem: Nutrition: Goal: Adequate nutrition will be maintained 07/03/2021 1033 by Shelton Silvas, RN Outcome: Adequate for Discharge 07/03/2021 1032 by Shelton Silvas, RN Outcome: Progressing   Problem:  Coping: Goal: Level of anxiety will decrease 07/03/2021 1033 by Shelton Silvas, RN Outcome: Adequate for Discharge 07/03/2021 1032 by Shelton Silvas, RN Outcome: Progressing   Problem: Elimination: Goal: Will not experience complications related to bowel motility 07/03/2021 1033 by Shelton Silvas, RN Outcome: Adequate for Discharge 07/03/2021 1032 by Shelton Silvas, RN Outcome: Progressing Goal: Will not experience complications related to urinary retention 07/03/2021 1033 by Shelton Silvas, RN Outcome: Adequate for Discharge 07/03/2021 1032 by Shelton Silvas, RN Outcome: Progressing   Problem: Pain Managment: Goal: General experience of comfort will improve 07/03/2021 1033 by Shelton Silvas, RN Outcome: Adequate for Discharge 07/03/2021 1032 by Shelton Silvas, RN Outcome: Progressing   Problem: Safety: Goal: Ability to remain free from injury will improve 07/03/2021 1033 by Shelton Silvas, RN Outcome: Adequate for Discharge 07/03/2021 1032 by Shelton Silvas, RN Outcome: Progressing   Problem: Skin Integrity: Goal: Risk for impaired skin integrity will decrease 07/03/2021 1033 by Shelton Silvas, RN Outcome: Adequate for Discharge 07/03/2021 1032 by Shelton Silvas, RN Outcome: Progressing   Problem: Education: Goal: Understanding of CV disease, CV risk reduction, and recovery process will improve 07/03/2021 1033 by Shelton Silvas, RN Outcome: Adequate for Discharge 07/03/2021 1032 by Shelton Silvas, RN Outcome: Progressing Goal: Individualized Educational Video(s) 07/03/2021 1033 by Shelton Silvas, RN Outcome: Adequate for Discharge 07/03/2021 1032 by Shelton Silvas, RN Outcome: Progressing   Problem: Activity: Goal: Ability to return to baseline activity level will improve 07/03/2021 1033 by Shelton Silvas, RN Outcome: Adequate for Discharge 07/03/2021 1032 by Shelton Silvas, RN Outcome: Progressing   Problem: Cardiovascular: Goal: Ability to achieve and maintain adequate  cardiovascular perfusion will improve 07/03/2021 1033 by Shelton Silvas, RN Outcome: Adequate for Discharge 07/03/2021 1032 by Shelton Silvas, RN Outcome: Progressing Goal: Vascular access  site(s) Level 0-1 will be maintained 07/03/2021 1033 by Shelton Silvas, RN Outcome: Adequate for Discharge 07/03/2021 1032 by Shelton Silvas, RN Outcome: Progressing   Problem: Health Behavior/Discharge Planning: Goal: Ability to safely manage health-related needs after discharge will improve 07/03/2021 1033 by Shelton Silvas, RN Outcome: Adequate for Discharge 07/03/2021 1032 by Shelton Silvas, RN Outcome: Progressing

## 2021-07-03 NOTE — Discharge Summary (Signed)
Physician Discharge Summary  Patient ID: Carmen Fernandez MRN: [id number] DOB/AGE: 1946-08-29 75 y.o.  Admit date: 06/30/2021 Discharge date: 07/03/2021  Admission Diagnoses: Acute coronary syndrome Hypertension Paroxysmal SVT  Discharge Diagnoses:  Principle diagnosis:    Acute coronary syndrome [facility]) Active problems:   Hypertension   Paroxysmal SVT   Diet controlled type 2 DM    Discharged Condition: good  Hospital Course: 75 years old white female with h/o hypertension was store shopping and had chest pressure, shortness of breath and dizziness with palpitation. She had narrow complex SVT lasting for 20 + minutes before self terminating into sinus rhythm. She was treated with metoprolol and small dose of diltiazem. She had mildly elevated HS-Troponin I levels. Her echocardiogram showed preserved LV systolic function. She underwent cardiac catheterization which showed severe diagonal 1 osteal lesion. Medical treatment was considered as it was a small vessel. Her right radial cath site was improving with mild local discomfort and right groin cathsite was stable without pain or discharge. Her B-blocker dose for adjusted and diltiazem was discontinued as she had sinus bradycardia.  She was discharged home in stable condition and will follow me in 1 week and primary doctor in 2 weeks.  Consults: cardiology  Significant Diagnostic Studies: labs: Near normal CBC, BMET. Mildly elevated HS-Troponin-I, peaking at 127 ng/L.  EKG: NSR.  Chest -X-ray: No active disease  Echocardiogram: Normal LV systolic function.  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  Treatments: cardiac meds: metoprolol, Aspirin, Clopidogrel, Atorvastatin and Losartan.  Discharge Exam: Blood pressure (!) 149/67, pulse 60, temperature 97.8 F (36.6 C), temperature source Oral, resp. rate 17, height '5\' 6"'$  (1.676 m), weight 66.6 kg, SpO2 98 %. General appearance: alert,  cooperative and appears stated age. Head: Normocephalic, atraumatic. Eyes: Blue eyes, pink conjunctiva, corneas clear. PERRL, EOM's intact.  Neck: No adenopathy, no carotid bruit, no JVD, supple, symmetrical, trachea midline and thyroid not enlarged. Resp: Clear to auscultation bilaterally. Cardio: Regular rate and rhythm, S1, S2 normal, II/VI systolic murmur, no click, rub or gallop. GI: Soft, non-tender; bowel sounds normal; no organomegaly. Extremities: No edema, cyanosis or clubbing. Mild tenderness around right radial cath site. Skin: Warm and dry.  Neurologic: Alert and oriented X 3, normal strength and tone. Normal coordination and gait.  Disposition: Discharge disposition: 01-Home or Self Care        Allergies as of 07/03/2021       Reactions   Elemental Sulfur    Penicillins    REACTION: as a child        Medication List     TAKE these medications    acetaminophen 500 MG tablet Commonly known as: TYLENOL Take 1,000 mg by mouth every 6 (six) hours as needed for moderate pain.   ARTIFICIAL TEARS OP Place 1 drop into both eyes daily as needed (dry eyes).   aspirin 81 MG EC tablet Take 1 tablet (81 mg total) by mouth daily. Swallow whole. Start taking on: July 04, 2021   atorvastatin 80 MG tablet Commonly known as: LIPITOR Take 1 tablet (80 mg total) by mouth daily. Start taking on: July 04, 2021 What changed:  medication strength how much to take   clopidogrel 75 MG tablet Commonly known as: PLAVIX Take 1 tablet (75 mg total) by mouth daily.   isosorbide mononitrate 30 MG 24 hr tablet Commonly known as: IMDUR Take 0.5 tablets (15 mg total) by mouth daily.   losartan 50 MG tablet  Commonly known as: COZAAR Take 1 tablet (50 mg total) by mouth daily. Start taking on: July 04, 2021   metoprolol tartrate 25 MG tablet Commonly known as: LOPRESSOR Take 0.5 tablets (12.5 mg total) by mouth 2 (two) times daily.   nitroGLYCERIN 0.4 MG SL  tablet Commonly known as: NITROSTAT Place 1 tablet (0.4 mg total) under the tongue every 5 (five) minutes x 3 doses as needed for chest pain.   vitamin B-12 1000 MCG tablet Commonly known as: CYANOCOBALAMIN Take 1,000 mcg by mouth daily.   Vitamin D3 25 MCG (1000 UT) Caps Take 1,000 Units by mouth daily.        Follow-up Information     [name], [place]. Schedule an appointment as soon as possible for a visit in 2 week(s).   Specialty: Internal Medicine Contact information: [address] [place] [address] [phone]         Dixie Dials, MD. Schedule an appointment as soon as possible for a visit in 1 week(s).   Specialty: Cardiology Contact information: [place] [address] [phone]                 Time spent: Review of old chart, current chart, lab, x-ray, cardiac tests and discussion with patient over 60 minutes.  Signed: [name] 07/03/2021, 9:55 AM

## 2021-07-06 DIAGNOSIS — I10 Essential (primary) hypertension: Secondary | ICD-10-CM | POA: Diagnosis not present

## 2021-07-06 DIAGNOSIS — I2510 Atherosclerotic heart disease of native coronary artery without angina pectoris: Secondary | ICD-10-CM | POA: Diagnosis not present

## 2021-07-06 DIAGNOSIS — E7800 Pure hypercholesterolemia, unspecified: Secondary | ICD-10-CM | POA: Diagnosis not present

## 2021-07-06 DIAGNOSIS — R7303 Prediabetes: Secondary | ICD-10-CM | POA: Diagnosis not present

## 2021-07-11 DIAGNOSIS — I2510 Atherosclerotic heart disease of native coronary artery without angina pectoris: Secondary | ICD-10-CM | POA: Diagnosis not present

## 2021-07-11 DIAGNOSIS — I10 Essential (primary) hypertension: Secondary | ICD-10-CM | POA: Diagnosis not present

## 2021-07-11 DIAGNOSIS — E7849 Other hyperlipidemia: Secondary | ICD-10-CM | POA: Diagnosis not present

## 2021-07-11 DIAGNOSIS — E119 Type 2 diabetes mellitus without complications: Secondary | ICD-10-CM | POA: Diagnosis not present

## 2021-10-23 DIAGNOSIS — K5909 Other constipation: Secondary | ICD-10-CM | POA: Diagnosis not present

## 2021-11-07 DIAGNOSIS — R110 Nausea: Secondary | ICD-10-CM | POA: Diagnosis not present

## 2021-11-07 DIAGNOSIS — Z23 Encounter for immunization: Secondary | ICD-10-CM | POA: Diagnosis not present

## 2021-11-07 DIAGNOSIS — R61 Generalized hyperhidrosis: Secondary | ICD-10-CM | POA: Diagnosis not present

## 2021-11-07 DIAGNOSIS — R002 Palpitations: Secondary | ICD-10-CM | POA: Diagnosis not present

## 2021-11-07 DIAGNOSIS — R55 Syncope and collapse: Secondary | ICD-10-CM | POA: Diagnosis not present

## 2021-11-09 DIAGNOSIS — R002 Palpitations: Secondary | ICD-10-CM | POA: Diagnosis not present

## 2021-11-12 DIAGNOSIS — I10 Essential (primary) hypertension: Secondary | ICD-10-CM | POA: Diagnosis not present

## 2021-11-12 DIAGNOSIS — R0682 Tachypnea, not elsewhere classified: Secondary | ICD-10-CM | POA: Diagnosis not present

## 2021-11-13 DIAGNOSIS — I2510 Atherosclerotic heart disease of native coronary artery without angina pectoris: Secondary | ICD-10-CM | POA: Diagnosis not present

## 2021-11-13 DIAGNOSIS — I10 Essential (primary) hypertension: Secondary | ICD-10-CM | POA: Diagnosis not present

## 2021-11-13 DIAGNOSIS — E7800 Pure hypercholesterolemia, unspecified: Secondary | ICD-10-CM | POA: Diagnosis not present

## 2021-11-20 DIAGNOSIS — I10 Essential (primary) hypertension: Secondary | ICD-10-CM | POA: Diagnosis not present

## 2021-11-20 DIAGNOSIS — R42 Dizziness and giddiness: Secondary | ICD-10-CM | POA: Diagnosis not present

## 2021-11-29 DIAGNOSIS — R002 Palpitations: Secondary | ICD-10-CM | POA: Diagnosis not present

## 2021-12-11 DIAGNOSIS — I10 Essential (primary) hypertension: Secondary | ICD-10-CM | POA: Diagnosis not present

## 2021-12-11 DIAGNOSIS — R002 Palpitations: Secondary | ICD-10-CM | POA: Diagnosis not present

## 2021-12-11 DIAGNOSIS — I2510 Atherosclerotic heart disease of native coronary artery without angina pectoris: Secondary | ICD-10-CM | POA: Diagnosis not present

## 2021-12-27 ENCOUNTER — Ambulatory Visit: Payer: Medicare Other | Admitting: Cardiology

## 2021-12-27 ENCOUNTER — Encounter: Payer: Self-pay | Admitting: Cardiology

## 2021-12-27 ENCOUNTER — Other Ambulatory Visit: Payer: Self-pay

## 2021-12-27 VITALS — BP 137/82 | HR 65 | Temp 97.9°F | Resp 17 | Ht 66.0 in | Wt 151.6 lb

## 2021-12-27 DIAGNOSIS — E7800 Pure hypercholesterolemia, unspecified: Secondary | ICD-10-CM | POA: Diagnosis not present

## 2021-12-27 DIAGNOSIS — I10 Essential (primary) hypertension: Secondary | ICD-10-CM | POA: Diagnosis not present

## 2021-12-27 DIAGNOSIS — I2510 Atherosclerotic heart disease of native coronary artery without angina pectoris: Secondary | ICD-10-CM

## 2021-12-27 NOTE — Progress Notes (Signed)
Primary Physician/Referring:  Delma Officer, PA  Patient ID: Carmen Fernandez, female    DOB: 06-03-1946, 76 y.o.   MRN: [id number]  Chief Complaint  Patient presents with   Coronary Artery Disease   New Patient (Initial Visit)   HPI:    Carmen Fernandez  is a 76 y.o. Caucasian female patient with hyperlipidemia, primary hypertension, admitted to the hospital in August 2022 with chest pain, dyspnea and palpitations and very small NSTEMI, cardiac catheterization revealing a hazy and ulcerated 80 to 90% small D1 disease recommended medical therapy.   She made an appointment to established with me, since the episode of NSTEMI, patient has been extremely anxious and has had an episode of severe palpitations and anxiety and EMS was activated.  She did not make another trip to the emergency room, but was evaluated by PCP, was referred to me for further management.  She has also had dizziness and gait imbalance, was started on meclizine and since then is feeling well.  She is also on antianxiety medication.  She states that since medication changes she is feeling the best she has in quite a while and denies any symptoms right now.  But she was happy to establish with me.  Past Medical History:  Diagnosis Date   FECAL OCCULT BLOOD    HEMOCCULT POSITIVE STOOL    HYPERTENSION    NONSPEC ABNORM PAP CERV UNSATISFACTORY CYTOLOGY    Past Surgical History:  Procedure Laterality Date   ABDOMINAL HYSTERECTOMY     COSMETIC SURGERY     EYE SURGERY     HERNIA REPAIR     LEFT HEART CATH AND CORONARY ANGIOGRAPHY N/A 07/02/2021   Procedure: LEFT HEART CATH AND CORONARY ANGIOGRAPHY;  Surgeon: Orpah Cobb, MD;  Location: [facility];  Service: Cardiovascular;  Laterality: N/A;   TUBAL LIGATION     Family History  Problem Relation Age of Onset   Hypertension Mother    Lung cancer Father 27   Hypertension Sister     Social History   Tobacco Use   Smoking status: Never   Smokeless  tobacco: Never  Substance Use Topics   Alcohol use: Yes    Alcohol/week: 2.0 standard drinks    Types: 2 Standard drinks or equivalent per week    Comment: OCC   Marital Status: Single  ROS  Review of Systems  Cardiovascular:  Negative for chest pain, dyspnea on exertion and leg swelling.  Objective  Blood pressure 137/82, pulse 65, temperature 97.9 F (36.6 C), temperature source Temporal, resp. rate 17, height 5\' 6"  (1.676 m), weight 151 lb 9.6 oz (68.8 kg), SpO2 97 %. Body mass index is 24.47 kg/m.  Vitals with BMI 12/27/2021 12/27/2021 07/03/2021  Height - 5\' 6"  -  Weight - 151 lbs 10 oz -  BMI - 24.48 -  Systolic 137 155 09/02/2021  Diastolic 82 74 67  Pulse 65 73 -    Physical Exam Neck:     Vascular: No carotid bruit or JVD.  Cardiovascular:     Rate and Rhythm: Normal rate and regular rhythm.     Pulses: Intact distal pulses.     Heart sounds: Normal heart sounds. No murmur heard.   No gallop.  Pulmonary:     Effort: Pulmonary effort is normal.     Breath sounds: Normal breath sounds.  Abdominal:     General: Bowel sounds are normal.     Palpations: Abdomen is soft.  Musculoskeletal:  Right lower leg: No edema.     Left lower leg: No edema.     Medications and allergies   Allergies  Allergen Reactions   Elemental Sulfur    Penicillins     REACTION: as a child     Medication prior to this encounter:   Outpatient Medications Prior to Visit  Medication Sig Dispense Refill   acetaminophen (TYLENOL) 500 MG tablet Take 1,000 mg by mouth every 6 (six) hours as needed for moderate pain.     aspirin EC 81 MG EC tablet Take 1 tablet (81 mg total) by mouth daily. Swallow whole. 30 tablet 11   atorvastatin (LIPITOR) 80 MG tablet Take 1 tablet (80 mg total) by mouth daily. 30 tablet 3   Azelastine HCl 137 MCG/SPRAY SOLN Place 1 spray into both nostrils as needed.     CALCIUM-MAGNESIUM-ZINC PO Take 1 tablet by mouth daily.     Carboxymethylcellulose Sodium (ARTIFICIAL  TEARS OP) Place 1 drop into both eyes daily as needed (dry eyes).     cholecalciferol (VITAMIN D) 25 MCG (1000 UNIT) tablet Take 1 tablet by mouth daily.     losartan (COZAAR) 100 MG tablet Take 100 mg by mouth daily.     nitroGLYCERIN (NITROSTAT) 0.4 MG SL tablet Place 1 tablet (0.4 mg total) under the tongue every 5 (five) minutes x 3 doses as needed for chest pain. 25 tablet 1   sertraline (ZOLOFT) 50 MG tablet Take 50 mg by mouth daily.     vitamin B-12 (CYANOCOBALAMIN) 1000 MCG tablet Take 1,000 mcg by mouth daily.     Cholecalciferol (VITAMIN D3) 25 MCG (1000 UT) CAPS Take 1,000 Units by mouth daily.     isosorbide mononitrate (IMDUR) 30 MG 24 hr tablet Take 0.5 tablets (15 mg total) by mouth daily. 30 tablet 3   losartan (COZAAR) 50 MG tablet Take 1 tablet (50 mg total) by mouth daily. 30 tablet 3   calcium carbonate (OS-CAL) 600 MG TABS tablet Take 1 tablet by mouth daily.     clopidogrel (PLAVIX) 75 MG tablet Take 1 tablet (75 mg total) by mouth daily. 30 tablet 3   metoprolol tartrate (LOPRESSOR) 25 MG tablet Take 0.5 tablets (12.5 mg total) by mouth 2 (two) times daily. 30 tablet 3   No facility-administered medications prior to visit.     Medication list after today's encounter   Current Outpatient Medications  Medication Instructions   acetaminophen (TYLENOL) 1,000 mg, Oral, Every 6 hours PRN   aspirin 81 mg, Oral, Daily, Swallow whole.   atorvastatin (LIPITOR) 80 mg, Oral, Daily   Azelastine HCl 137 MCG/SPRAY SOLN 1 spray, Each Nare, As needed   CALCIUM-MAGNESIUM-ZINC PO 1 tablet, Oral, Daily   Carboxymethylcellulose Sodium (ARTIFICIAL TEARS OP) 1 drop, Both Eyes, Daily PRN   cholecalciferol (VITAMIN D) 25 MCG (1000 UNIT) tablet 1 tablet, Oral, Daily   losartan (COZAAR) 100 mg, Oral, Daily   nitroGLYCERIN (NITROSTAT) 0.4 mg, Sublingual, Every 5 min x3 PRN   sertraline (ZOLOFT) 50 mg, Oral, Daily   vitamin B-12 (CYANOCOBALAMIN) 1,000 mcg, Oral, Daily    Laboratory  examination:   Recent Labs    07/01/21 0514 07/02/21 0034 07/03/21 0152  NA 141 139 136  K 3.9 4.0 3.8  CL 108 108 103  CO2 $Re'26 26 25  'zzy$ GLUCOSE 105* 109* 105*  BUN $Re'8 11 11  'bRq$ CREATININE 0.79 0.71 0.81  CALCIUM 8.9 8.7* 8.6*  GFRNONAA >60 >60 >60   CrCl cannot be calculated (  Patient's most recent lab result is older than the maximum 21 days allowed.).  CMP Latest Ref Rng & Units 07/03/2021 07/02/2021 07/01/2021  Glucose 70 - 99 mg/dL 105(H) 109(H) 105(H)  BUN 8 - 23 mg/dL $Remove'11 11 8  'SpLGdrP$ Creatinine 0.44 - 1.00 mg/dL 0.81 0.71 0.79  Sodium 135 - 145 mmol/L 136 139 141  Potassium 3.5 - 5.1 mmol/L 3.8 4.0 3.9  Chloride 98 - 111 mmol/L 103 108 108  CO2 22 - 32 mmol/L $RemoveB'25 26 26  'uHALLnBF$ Calcium 8.9 - 10.3 mg/dL 8.6(L) 8.7(L) 8.9  Total Protein 6.1 - 8.1 g/dL - - -  Total Bilirubin 0.2 - 1.2 mg/dL - - -  Alkaline Phos 33 - 130 U/L - - -  AST 10 - 35 U/L - - -  ALT 6 - 29 U/L - - -   CBC Latest Ref Rng & Units 07/03/2021 07/01/2021 07/01/2021  WBC 4.0 - 10.5 K/uL 7.1 6.9 6.9  Hemoglobin 12.0 - 15.0 g/dL 11.8(L) 12.2 11.9(L)  Hematocrit 36.0 - 46.0 % 35.9(L) 37.6 36.0  Platelets 150 - 400 K/uL 278 298 285    Lipid Panel Recent Labs    07/01/21 0514  CHOL 144  TRIG 76  LDLCALC 89  VLDL 15  HDL 40*  CHOLHDL 3.6   HEMOGLOBIN A1C Lab Results  Component Value Date   HGBA1C 5.9 (H) 07/01/2021   MPG 122.63 07/01/2021   External labs:   Labs 11/07/2021:  TSH normal at 3.52.  Hb 12.4/HCT 36.3, platelets 329.  Serum glucose 92 mg, BUN 11, creatinine 0.73, EGFR 85 mill, potassium 4.6, LFTs normal.  Radiology:   No results found.  Cardiac Studies:   Coronary angiogram 07/02/2021: Normal coronary arteries except D1 with a 80% ostial stenosis.  Normal LVEF and EDP.   Small D1 with ostial 80-90% and large D-2 and mild disease in mid to distal LAD  Large dominant normal RCA  Echocardiogram 07/01/2021:  1. Left ventricular ejection fraction, by estimation, is 55 to 60%. The left ventricle has  normal function. The left ventricle has no regional wall motion abnormalities. Left ventricular diastolic parameters are consistent with Grade I diastolic  dysfunction (impaired relaxation).  2. Right ventricular systolic function is normal. The right ventricular size is normal.  3. The mitral valve is degenerative. Trivial mitral valve regurgitation.  4. The aortic valve is tricuspid. There is mild calcification of the aortic valve. There is mild thickening of the aortic valve. Aortic valve regurgitation is not visualized. Mild aortic valve sclerosis is present, with no evidence of aortic valve stenosis.  EKG:   EKG 12/27/2021: Normal sinus rhythm at rate of 69 bpm, normal axis, no evidence of ischemia.  Normal EKG.    Assessment     ICD-10-CM   1. Coronary artery disease involving native coronary artery of native heart without angina pectoris  I25.10     2. Pure hypercholesterolemia  E78.00     3. Primary hypertension  I10 EKG 12-Lead      No orders of the defined types were placed in this encounter.  Orders Placed This Encounter  Procedures   EKG 12-Lead   Recommendations:   Carmen Fernandez is a 76 y.o. Caucasian female patient with hyperlipidemia, primary hypertension, admitted to the hospital in August 2022 with chest pain, dyspnea and palpitations and very small NSTEMI, cardiac catheterization revealing a hazy and ulcerated 80 to 90% small D1 disease recommended medical therapy.  I have personally reviewed the angiograms, the D1 branch  of LAD is very small, probably measuring about 1.5 mm and not amenable for angioplasty but I suspect that with aggressive lipid-lowering therapy, which she has already achieved low LDL, the D1 stenosis should improve along with improvement in mid to distal LAD mild diffuse disease.  I have reassured her and advised her that there is no restrictions with regard to activity and she can continue and start doing exercise regularly.  She is having  frequent headaches, will discontinue isosorbide mononitrate.  I agree with discontinuing Plavix that she was started on in the hospital, she has no other significant disease, circumflex and RCA are normal, aspirin 81 mg daily along with high intensity statin is all she needs for now.  Blood pressure is also very well controlled.  I would like to see her back in 3 months as she is new to me.  I have reviewed the coronary angiograms with the patient personally.  Patient felt reassured.   Adrian Prows, MD, [facility] 12/27/2021, 5:32 PM Office: [phone]

## 2022-01-07 DIAGNOSIS — I2510 Atherosclerotic heart disease of native coronary artery without angina pectoris: Secondary | ICD-10-CM | POA: Diagnosis not present

## 2022-01-07 DIAGNOSIS — E7800 Pure hypercholesterolemia, unspecified: Secondary | ICD-10-CM | POA: Diagnosis not present

## 2022-01-07 DIAGNOSIS — I10 Essential (primary) hypertension: Secondary | ICD-10-CM | POA: Diagnosis not present

## 2022-02-25 ENCOUNTER — Telehealth: Payer: Self-pay

## 2022-02-25 DIAGNOSIS — I10 Essential (primary) hypertension: Secondary | ICD-10-CM

## 2022-02-25 MED ORDER — OLMESARTAN MEDOXOMIL-HCTZ 40-12.5 MG PO TABS: 1.0000 | ORAL_TABLET | ORAL | 2 refills | Status: DC

## 2022-02-25 NOTE — Telephone Encounter (Signed)
Let her know to stop Losartan and I have sent new Rx. To see how she does with this.  Get Blood work in 2-3 weeks at [facility]

## 2022-02-25 NOTE — Telephone Encounter (Signed)
Patient called and stated that her BP has been 140-150'/60;s She has also been feeling lightheaded when those numbers are elevated ? ?Patient wants to know at point she should start worrying about her BP numbers or come in to be seen? Please advise.  ? ?[phone] ?

## 2022-02-25 NOTE — Telephone Encounter (Addendum)
ICD-10-CM   ?1. Primary hypertension  I10 olmesartan-hydrochlorothiazide (BENICAR HCT) 40-12.5 MG tablet  ?  Basic metabolic panel  ?  ? ?Meds ordered this encounter  ?Medications  ? olmesartan-hydrochlorothiazide (BENICAR HCT) 40-12.5 MG tablet  ?  Sig: Take 1 tablet by mouth every morning.  ?  Dispense:  30 tablet  ?  Refill:  2  ?  Discontinue Losartan - Efficacy  ?  ?Medications Discontinued During This Encounter  ?Medication Reason  ? losartan (COZAAR) 100 MG tablet Ineffective  ?  ?

## 2022-02-25 NOTE — Addendum Note (Signed)
Addended by: Kela Millin on: 02/25/2022 05:46 PM ? ? Modules accepted: Orders ? ?

## 2022-02-26 NOTE — Telephone Encounter (Signed)
Patient is aware to STOP Losartan and start BENICAR HCT. She is also aware to go have blood work in 2-3 weeks.

## 2022-03-12 DIAGNOSIS — I10 Essential (primary) hypertension: Secondary | ICD-10-CM | POA: Diagnosis not present

## 2022-03-13 LAB — BASIC METABOLIC PANEL
BUN/Creatinine Ratio: 18 (ref 12–28)
BUN: 13 mg/dL (ref 8–27)
CO2: 23 mmol/L (ref 20–29)
Calcium: 9.5 mg/dL (ref 8.7–10.3)
Chloride: 99 mmol/L (ref 96–106)
Creatinine, Ser: 0.73 mg/dL (ref 0.57–1.00)
Glucose: 96 mg/dL (ref 70–99)
Potassium: 5 mmol/L (ref 3.5–5.2)
Sodium: 134 mmol/L (ref 134–144)
eGFR: 85 mL/min/{1.73_m2} (ref >59)

## 2022-03-27 ENCOUNTER — Encounter: Payer: Self-pay | Admitting: Cardiology

## 2022-03-27 ENCOUNTER — Ambulatory Visit: Payer: Medicare Other | Admitting: Cardiology

## 2022-03-27 VITALS — BP 109/68 | HR 76 | Temp 97.0°F | Resp 16 | Ht 66.0 in | Wt 156.0 lb

## 2022-03-27 DIAGNOSIS — I2510 Atherosclerotic heart disease of native coronary artery without angina pectoris: Secondary | ICD-10-CM | POA: Diagnosis not present

## 2022-03-27 DIAGNOSIS — E7800 Pure hypercholesterolemia, unspecified: Secondary | ICD-10-CM

## 2022-03-27 NOTE — Progress Notes (Signed)
? ?Primary Physician/Referring:  [name], PA ? ?Patient ID: Carmen Fernandez, female    DOB: 07/05/1946, 76 y.o.   MRN: [id number] ? ?Chief Complaint  ?Patient presents with  ? Coronary Artery Disease  ? Hypertension  ? Follow-up  ?  3 months  ? ?HPI:   ? ?Carmen Fernandez  is a 76 y.o. Caucasian female patient with hyperlipidemia, primary hypertension, coronary artery disease when she presented with NSTEMI in August 2022 with a small hazy ulcerated D1 high-grade stenosis recommended medical therapy.  Past medical history significant for hypertension and mild hyperlipidemia.    ? ?She remains asymptomatic, states that since switching the medications blood pressure is very well controlled.  She has not had any further headaches since discontinuing isosorbide mononitrate. ? ?Past Medical History:  ?Diagnosis Date  ? FECAL OCCULT BLOOD   ? HEMOCCULT POSITIVE STOOL   ? HYPERTENSION   ? NONSPEC ABNORM PAP CERV UNSATISFACTORY CYTOLOGY   ? ?Past Surgical History:  ?Procedure Laterality Date  ? ABDOMINAL HYSTERECTOMY    ? COSMETIC SURGERY    ? EYE SURGERY    ? HERNIA REPAIR    ? LEFT HEART CATH AND CORONARY ANGIOGRAPHY N/A 07/02/2021  ? Procedure: LEFT HEART CATH AND CORONARY ANGIOGRAPHY;  Surgeon: Dixie Dials, MD;  Location: [facility];  Service: Cardiovascular;  Laterality: N/A;  ? TUBAL LIGATION    ? ?Family History  ?Problem Relation Age of Onset  ? Hypertension Mother   ? Lung cancer Father 43  ? Hypertension Sister   ?  ?Social History  ? ?Tobacco Use  ? Smoking status: Never  ? Smokeless tobacco: Never  ?Substance Use Topics  ? Alcohol use: Yes  ?  Alcohol/week: 2.0 standard drinks  ?  Types: 2 Standard drinks or equivalent per week  ?  Comment: OCC  ? ?Marital Status: Single  ?ROS  ?Review of Systems  ?Cardiovascular:  Negative for chest pain, dyspnea on exertion and leg swelling.  ?Objective  ?Blood pressure 109/68, pulse 76, temperature (!) 97 ?F (36.1 ?C), temperature source Temporal, resp. rate  16, height $RemoveBe'5\' 6"'vcceQEmGw$  (1.676 m), weight 156 lb (70.8 kg), SpO2 98 %. Body mass index is 25.18 kg/m?.  ? ?  03/27/2022  ?  2:20 PM 12/27/2021  ?  9:28 AM 12/27/2021  ?  9:22 AM  ?Vitals with BMI  ?Height $Remove'5\' 6"'igwgFSS$   '5\' 6"'$   ?Weight 156 lbs  151 lbs 10 oz  ?BMI 25.19  24.48  ?Systolic 932 355 732  ?Diastolic 68 82 74  ?Pulse 76 65 73  ?  ?Physical Exam ?Neck:  ?   Vascular: No carotid bruit or JVD.  ?Cardiovascular:  ?   Rate and Rhythm: Normal rate and regular rhythm.  ?   Pulses: Intact distal pulses.  ?   Heart sounds: Normal heart sounds. No murmur heard. ?  No gallop.  ?Pulmonary:  ?   Effort: Pulmonary effort is normal.  ?   Breath sounds: Normal breath sounds.  ?Abdominal:  ?   General: Bowel sounds are normal.  ?   Palpations: Abdomen is soft.  ?Musculoskeletal:  ?   Right lower leg: No edema.  ?   Left lower leg: No edema.  ?  ? ?Medications and allergies  ? ?Allergies  ?Allergen Reactions  ? Elemental Sulfur   ? Penicillins   ?  REACTION: as a child  ?  ? ?Medication list after today's encounter  ? ?Current Outpatient Medications:  ?  acetaminophen (TYLENOL) 500 MG tablet, Take 1,000 mg by mouth every 6 (six) hours as needed for moderate pain., Disp: , Rfl:  ?  aspirin EC 81 MG EC tablet, Take 1 tablet (81 mg total) by mouth daily. Swallow whole., Disp: 30 tablet, Rfl: 11 ?  atorvastatin (LIPITOR) 80 MG tablet, Take 1 tablet (80 mg total) by mouth daily., Disp: 30 tablet, Rfl: 3 ?  cholecalciferol (VITAMIN D) 25 MCG (1000 UNIT) tablet, Take 1 tablet by mouth daily., Disp: , Rfl:  ?  nitroGLYCERIN (NITROSTAT) 0.4 MG SL tablet, Place 1 tablet (0.4 mg total) under the tongue every 5 (five) minutes x 3 doses as needed for chest pain., Disp: 25 tablet, Rfl: 1 ?  olmesartan-hydrochlorothiazide (BENICAR HCT) 40-12.5 MG tablet, Take 1 tablet by mouth every morning., Disp: 30 tablet, Rfl: 2 ?  sertraline (ZOLOFT) 50 MG tablet, Take 50 mg by mouth daily., Disp: , Rfl:  ?  vitamin B-12 (CYANOCOBALAMIN) 1000 MCG tablet, Take 1,000 mcg  by mouth daily., Disp: , Rfl:   ? ?Laboratory examination:  ? ?Recent Labs  ?  07/01/21 ?2707 07/02/21 ?0034 07/03/21 ?0152 03/12/22 ?1400  ?NA 141 139 136 134  ?K 3.9 4.0 3.8 5.0  ?CL 108 108 103 99  ?CO2 $Rem'26 26 25 23  'QODy$ ?GLUCOSE 105* 109* 105* 96  ?BUN $Rem'8 11 11 13  'hpPK$ ?CREATININE 0.79 0.71 0.81 0.73  ?CALCIUM 8.9 8.7* 8.6* 9.5  ?GFRNONAA >60 >60 >60  --   ? ? ?estimated creatinine clearance is 56 mL/min (by C-G formula based on SCr of 0.73 mg/dL).  ? ?  Latest Ref Rng & Units 03/12/2022  ?  2:00 PM 07/03/2021  ?  1:52 AM 07/02/2021  ? 12:34 AM  ?CMP  ?Glucose 70 - 99 mg/dL 96   105   109    ?BUN 8 - 27 mg/dL $Remove'13   11   11    'Hhyqhrc$ ?Creatinine 0.57 - 1.00 mg/dL 0.73   0.81   0.71    ?Sodium 134 - 144 mmol/L 134   136   139    ?Potassium 3.5 - 5.2 mmol/L 5.0   3.8   4.0    ?Chloride 96 - 106 mmol/L 99   103   108    ?CO2 20 - 29 mmol/L $RemoveB'23   25   26    'NFxLGePL$ ?Calcium 8.7 - 10.3 mg/dL 9.5   8.6   8.7    ? ? ?  Latest Ref Rng & Units 07/03/2021  ?  1:52 AM 07/01/2021  ?  9:31 AM 07/01/2021  ?  5:14 AM  ?CBC  ?WBC 4.0 - 10.5 K/uL 7.1   6.9   6.9    ?Hemoglobin 12.0 - 15.0 g/dL 11.8   12.2   11.9    ?Hematocrit 36.0 - 46.0 % 35.9   37.6   36.0    ?Platelets 150 - 400 K/uL 278   298   285    ? ? ?Lipid Panel ?Recent Labs  ?  07/01/21 ?8675  ?CHOL 144  ?TRIG 76  ?Cottle 89  ?VLDL 15  ?HDL 40*  ?CHOLHDL 3.6  ? ? ?HEMOGLOBIN A1C ?Lab Results  ?Component Value Date  ? HGBA1C 5.9 (H) 07/01/2021  ? MPG 122.63 07/01/2021  ? ?External labs:  ? ?Labs 11/07/2021: ? ?TSH normal at 3.52. ? ?Hb 12.4/HCT 36.3, platelets 329. ? ?Serum glucose 92 mg, BUN 11, creatinine 0.73, EGFR 85 mill, potassium 4.6, LFTs normal. ? ?Radiology:  ? ?  Cxr SINGLE VIEW 06/30/2021: ?The cardiomediastinal silhouette is unremarkable. ?There is no evidence of focal airspace disease, pulmonary edema, ?suspicious pulmonary nodule/mass, pleural effusion, or pneumothorax. ?No acute bony abnormalities are identified. ? ?Cardiac Studies:  ? ?Coronary angiogram 07/02/2021: ?Normal coronary arteries  except D1 with a 80% ostial stenosis.  Normal LVEF and EDP. ? ? ?Small D1 with ostial 80-90% and large D-2 and mild disease in mid to distal LAD ? ?Large dominant normal RCA ? ?Echocardiogram 07/01/2021: ? 1. Left ventricular ejection fraction, by estimation, is 55 to 60%. The left ventricle has normal function. The left ventricle has no regional wall motion abnormalities. Left ventricular diastolic parameters are consistent with Grade I diastolic  ?dysfunction (impaired relaxation). ? 2. Right ventricular systolic function is normal. The right ventricular size is normal. ? 3. The mitral valve is degenerative. Trivial mitral valve regurgitation. ? 4. The aortic valve is tricuspid. There is mild calcification of the aortic valve. There is mild thickening of the aortic valve. Aortic valve regurgitation is not visualized. Mild aortic valve sclerosis is present, with no evidence of aortic valve stenosis. ? ?EKG:  ? ?EKG 12/27/2021: Normal sinus rhythm at rate of 69 bpm, normal axis, no evidence of ischemia.  Normal EKG.   ? ?Assessment  ? ?  ICD-10-CM   ?1. Coronary artery disease involving native coronary artery of native heart without angina pectoris  I25.10   ?  ?2. Pure hypercholesterolemia  E78.00   ?  ?  ?No orders of the defined types were placed in this encounter. ? ?No orders of the defined types were placed in this encounter. ? ?Recommendations:  ? ?Carmen Fernandez is a 76 y.o.  Caucasian female patient with hyperlipidemia, primary hypertension, coronary artery disease when she presented with NSTEMI in August 2022 with a small hazy ulcerated D1 high-grade stenosis recommended medical therapy.  Past medical history significant for hypertension and mild hyperlipidemia.  ? ?She is presently asymptomatic.  Since discontinuing isosorbide mononitrate, she has not had any further chest pain and heart headaches are completely resolved.  Also since changing from losartan to Benicar HCT, blood pressure is now very well  controlled. ? ?Her LDL goal is <70, she will follow-up with her PCP for labs next month.  Her Lipitor has been increased since hospital discharge to 80 mg, suspect her LDL will be in 50 range.  She is to

## 2022-04-08 DIAGNOSIS — H40013 Open angle with borderline findings, low risk, bilateral: Secondary | ICD-10-CM | POA: Diagnosis not present

## 2022-04-08 DIAGNOSIS — H26493 Other secondary cataract, bilateral: Secondary | ICD-10-CM | POA: Diagnosis not present

## 2022-04-08 DIAGNOSIS — Z961 Presence of intraocular lens: Secondary | ICD-10-CM | POA: Diagnosis not present

## 2022-05-09 DIAGNOSIS — Z0000 Encounter for general adult medical examination without abnormal findings: Secondary | ICD-10-CM | POA: Diagnosis not present

## 2022-05-09 DIAGNOSIS — E783 Hyperchylomicronemia: Secondary | ICD-10-CM | POA: Diagnosis not present

## 2022-05-09 DIAGNOSIS — I2510 Atherosclerotic heart disease of native coronary artery without angina pectoris: Secondary | ICD-10-CM | POA: Diagnosis not present

## 2022-05-09 DIAGNOSIS — I10 Essential (primary) hypertension: Secondary | ICD-10-CM | POA: Diagnosis not present

## 2022-05-09 DIAGNOSIS — M8580 Other specified disorders of bone density and structure, unspecified site: Secondary | ICD-10-CM | POA: Diagnosis not present

## 2022-05-09 DIAGNOSIS — R7309 Other abnormal glucose: Secondary | ICD-10-CM | POA: Diagnosis not present

## 2022-05-09 DIAGNOSIS — Z23 Encounter for immunization: Secondary | ICD-10-CM | POA: Diagnosis not present

## 2022-05-09 DIAGNOSIS — Z86010 Personal history of colonic polyps: Secondary | ICD-10-CM | POA: Diagnosis not present

## 2022-05-30 ENCOUNTER — Other Ambulatory Visit: Payer: Self-pay | Admitting: Cardiology

## 2022-05-30 DIAGNOSIS — I10 Essential (primary) hypertension: Secondary | ICD-10-CM

## 2022-07-17 ENCOUNTER — Telehealth: Payer: Self-pay | Admitting: *Deleted

## 2022-07-17 NOTE — Patient Outreach (Signed)
  Care Coordination   07/17/2022 Name: Carmen Fernandez MRN: [id number] DOB: 08-07-1946   Care Coordination Outreach Attempts:  An unsuccessful telephone outreach was attempted today to offer the patient information about available care coordination services as a benefit of their health plan.   Follow Up Plan:  Additional outreach attempts will be made to offer the patient care coordination information and services.   Encounter Outcome:  No Answer  Care Coordination Interventions Activated:  Yes   Care Coordination Interventions:  No, not indicated    [facility] [phone]

## 2022-09-01 ENCOUNTER — Other Ambulatory Visit: Payer: Self-pay | Admitting: Cardiology

## 2022-09-01 DIAGNOSIS — I10 Essential (primary) hypertension: Secondary | ICD-10-CM

## 2022-09-06 ENCOUNTER — Telehealth: Payer: Self-pay

## 2022-09-06 NOTE — Patient Outreach (Signed)
  Care Coordination   Initial Visit Note   09/06/2022 Name: NATALYIA INNES MRN: [id number] DOB: May 07, 1946  Tressie Stalker is a 76 y.o. year old female who sees [place], [place], [place] for primary care. I spoke with  Tressie Stalker by phone today.  What matters to the patients health and wellness today?  No Concerns Expressed/Patient Scheduled    Goals Addressed   None     SDOH assessments and interventions completed:  No     Care Coordination Interventions Activated:  No  Care Coordination Interventions:  No, not indicated   Follow up plan:  Will follow up next week.    Encounter Outcome:  Pt. Scheduled   [facility] [phone]

## 2022-09-27 ENCOUNTER — Ambulatory Visit: Payer: Medicare Other | Admitting: Cardiology

## 2022-10-07 ENCOUNTER — Ambulatory Visit: Payer: Medicare Other | Admitting: Cardiology

## 2022-10-07 ENCOUNTER — Encounter: Payer: Self-pay | Admitting: Cardiology

## 2022-10-07 VITALS — BP 130/66 | HR 71 | Temp 98.2°F | Resp 16 | Ht 66.0 in | Wt 156.4 lb

## 2022-10-07 DIAGNOSIS — I10 Essential (primary) hypertension: Secondary | ICD-10-CM

## 2022-10-07 DIAGNOSIS — E7800 Pure hypercholesterolemia, unspecified: Secondary | ICD-10-CM

## 2022-10-07 DIAGNOSIS — I2510 Atherosclerotic heart disease of native coronary artery without angina pectoris: Secondary | ICD-10-CM | POA: Diagnosis not present

## 2022-10-07 MED ORDER — ATORVASTATIN CALCIUM 80 MG PO TABS: 80.0000 mg | ORAL_TABLET | Freq: Every day | ORAL | 3 refills | Status: AC

## 2022-10-07 MED ORDER — OLMESARTAN MEDOXOMIL-HCTZ 40-12.5 MG PO TABS: 1.0000 | ORAL_TABLET | ORAL | 3 refills | Status: DC

## 2022-10-07 NOTE — Progress Notes (Signed)
Primary Physician/Referring:  [name], PA  Patient ID: Carmen Fernandez, female    DOB: 01/15/1946, 76 y.o.   MRN: [id number]  Chief Complaint  Patient presents with   Coronary Artery Disease   Hyperlipidemia   Follow-up    6 months   HPI:    Carmen Fernandez  is a 76 y.o. Caucasian female patient with hyperlipidemia, hyperglycemia, primary hypertension, coronary artery disease when she presented with NSTEMI in August 2022 with a small hazy ulcerated D1 high-grade stenosis recommended medical therapy.    She remains asymptomatic.  Past Medical History:  Diagnosis Date   FECAL OCCULT BLOOD    HEMOCCULT POSITIVE STOOL    HYPERTENSION    NONSPEC ABNORM PAP CERV UNSATISFACTORY CYTOLOGY    Past Surgical History:  Procedure Laterality Date   ABDOMINAL HYSTERECTOMY     COSMETIC SURGERY     EYE SURGERY     HERNIA REPAIR     LEFT HEART CATH AND CORONARY ANGIOGRAPHY N/A 07/02/2021   Procedure: LEFT HEART CATH AND CORONARY ANGIOGRAPHY;  Surgeon: Dixie Dials, MD;  Location: [facility];  Service: Cardiovascular;  Laterality: N/A;   TUBAL LIGATION     Family History  Problem Relation Age of Onset   Hypertension Mother    Lung cancer Father 61   Hypertension Sister     Social History   Tobacco Use   Smoking status: Never   Smokeless tobacco: Never  Substance Use Topics   Alcohol use: Yes    Alcohol/week: 2.0 standard drinks of alcohol    Types: 2 Standard drinks or equivalent per week    Comment: OCC   Marital Status: Single  ROS  Review of Systems  Cardiovascular:  Negative for chest pain, dyspnea on exertion and leg swelling.   Objective  Blood pressure 130/66, pulse 71, temperature 98.2 F (36.8 C), temperature source Temporal, resp. rate 16, height _0  (1.676 m), weight 156 lb 6.4 oz (70.9 kg), SpO2 98 %. Body mass index is 25.24 kg/m.     10/07/2022    9:20 AM 03/27/2022    2:20 PM 12/27/2021    9:28 AM  Vitals with BMI  Height _1  _2     Weight 156 lbs 6 oz 156 lbs   BMI 34.91 79.15   Systolic 056 979 480  Diastolic 66 68 82  Pulse 71 76 65    Physical Exam Neck:     Vascular: No carotid bruit or JVD.  Cardiovascular:     Rate and Rhythm: Normal rate and regular rhythm.     Pulses: Intact distal pulses.     Heart sounds: Normal heart sounds. No murmur heard.    No gallop.  Pulmonary:     Effort: Pulmonary effort is normal.     Breath sounds: Normal breath sounds.  Abdominal:     General: Bowel sounds are normal.     Palpations: Abdomen is soft.  Musculoskeletal:     Right lower leg: No edema.     Left lower leg: No edema.     Medications and allergies   Allergies  Allergen Reactions   Elemental Sulfur    Penicillins     REACTION: as a child    Medication list after today's encounter   Current Outpatient Medications:    acetaminophen (TYLENOL) 500 MG tablet, Take 1,000 mg by mouth every 6 (six) hours as needed for moderate pain., Disp: , Rfl:    aspirin EC 81  MG EC tablet, Take 1 tablet (81 mg total) by mouth daily. Swallow whole., Disp: 30 tablet, Rfl: 11   cholecalciferol (VITAMIN D) 25 MCG (1000 UNIT) tablet, Take 1 tablet by mouth daily., Disp: , Rfl:    nitroGLYCERIN (NITROSTAT) 0.4 MG SL tablet, Place 1 tablet (0.4 mg total) under the tongue every 5 (five) minutes x 3 doses as needed for chest pain., Disp: 25 tablet, Rfl: 1   sertraline (ZOLOFT) 50 MG tablet, Take 50 mg by mouth daily., Disp: , Rfl:    vitamin B-12 (CYANOCOBALAMIN) 1000 MCG tablet, Take 1,000 mcg by mouth daily., Disp: , Rfl:    atorvastatin (LIPITOR) 80 MG tablet, Take 1 tablet (80 mg total) by mouth daily., Disp: 90 tablet, Rfl: 3   olmesartan-hydrochlorothiazide (BENICAR HCT) 40-12.5 MG tablet, Take 1 tablet by mouth every morning., Disp: 90 tablet, Rfl: 3   Laboratory examination:   Lab Results  Component Value Date   NA 134 03/12/2022   K 5.0 03/12/2022   CO2 23 03/12/2022   GLUCOSE 96 03/12/2022   BUN 13 03/12/2022    CREATININE 0.73 03/12/2022   CALCIUM 9.5 03/12/2022   EGFR 85 03/12/2022   GFRNONAA >60 07/03/2021       Latest Ref Rng & Units 03/12/2022    2:00 PM 07/03/2021    1:52 AM 07/02/2021   12:34 AM  CMP  Glucose 70 - 99 mg/dL 96  105  109   BUN 8 - 27 mg/dL _0 Creatinine 0.57 - 1.00 mg/dL 0.73  0.81  0.71   Sodium 134 - 144 mmol/L 134  136  139   Potassium 3.5 - 5.2 mmol/L 5.0  3.8  4.0   Chloride 96 - 106 mmol/L 99  103  108   CO2 20 - 29 mmol/L _1 Calcium 8.7 - 10.3 mg/dL 9.5  8.6  8.7       Latest Ref Rng & Units 07/03/2021    1:52 AM 07/01/2021    9:31 AM 07/01/2021    5:14 AM  CBC  WBC 4.0 - 10.5 K/uL 7.1  6.9  6.9   Hemoglobin 12.0 - 15.0 g/dL 11.8  12.2  11.9   Hematocrit 36.0 - 46.0 % 35.9  37.6  36.0   Platelets 150 - 400 K/uL 278  298  285    Lab Results  Component Value Date   CHOL 144 07/01/2021   HDL 40 (L) 07/01/2021   LDLCALC 89 07/01/2021   TRIG 76 07/01/2021   CHOLHDL 3.6 07/01/2021    HEMOGLOBIN A1C Lab Results  Component Value Date   HGBA1C 5.9 (H) 07/01/2021   MPG 122.63 07/01/2021   External labs:   Cholesterol, total 130.000 m 05/09/2022 HDL 46.000 mg 05/09/2022 LDL 61.000 mg 05/09/2022 Triglycerides 129.000 m 05/09/2022  A1C 6.400 % 05/09/2022  Labs 11/07/2021:  TSH normal at 3.52.  Hb 12.4/HCT 36.3, platelets 329.  Serum glucose 92 mg, BUN 11, creatinine 0.73, EGFR 85 mill, potassium 4.6, LFTs normal.  Radiology:   Cxr SINGLE VIEW 06/30/2021: The cardiomediastinal silhouette is unremarkable. There is no evidence of focal airspace disease, pulmonary edema, suspicious pulmonary nodule/mass, pleural effusion, or pneumothorax. No acute bony abnormalities are identified.  Cardiac Studies:   Coronary angiogram 07/02/2021: Normal coronary arteries except D1 with a 80% ostial stenosis.  Normal LVEF and EDP.   Small D1 with ostial 80-90% and large D-2 and mild disease in mid to  distal LAD  Large dominant normal  RCA  Echocardiogram 07/01/2021:  1. Left ventricular ejection fraction, by estimation, is 55 to 60%. The left ventricle has normal function. The left ventricle has no regional wall motion abnormalities. Left ventricular diastolic parameters are consistent with Grade I diastolic  dysfunction (impaired relaxation).  2. Right ventricular systolic function is normal. The right ventricular size is normal.  3. The mitral valve is degenerative. Trivial mitral valve regurgitation.  4. The aortic valve is tricuspid. There is mild calcification of the aortic valve. There is mild thickening of the aortic valve. Aortic valve regurgitation is not visualized. Mild aortic valve sclerosis is present, with no evidence of aortic valve stenosis.  EKG:   EKG 10/07/2022: Normal sinus rhythm at rate of 68 bpm, normal EKG.  No change from 12/27/2021.  Assessment     ICD-10-CM   1. Coronary artery disease involving native coronary artery of native heart without angina pectoris  I25.10 EKG 12-Lead    2. Primary hypertension  I10 olmesartan-hydrochlorothiazide (BENICAR HCT) 40-12.5 MG tablet    3. Pure hypercholesterolemia  E78.00 atorvastatin (LIPITOR) 80 MG tablet      Meds ordered this encounter  Medications   atorvastatin (LIPITOR) 80 MG tablet    Sig: Take 1 tablet (80 mg total) by mouth daily.    Dispense:  90 tablet    Refill:  3   olmesartan-hydrochlorothiazide (BENICAR HCT) 40-12.5 MG tablet    Sig: Take 1 tablet by mouth every morning.    Dispense:  90 tablet    Refill:  3   Orders Placed This Encounter  Procedures   EKG 12-Lead    Recommendations:   RUTHELL FEIGENBAUM is a 76 y.o.  Caucasian female patient with hyperlipidemia, hyperglycemia, primary hypertension, coronary artery disease when she presented with NSTEMI in August 2022 with a small hazy ulcerated D1 high-grade stenosis recommended medical therapy.    1. Coronary artery disease involving native coronary artery of native heart  without angina pectoris Patient has been done aspirin, has not had any recurrence of angina pectoris.  2. Primary hypertension Since changing from losartan to Benicar HCT, blood pressure is now very well controlled.  Continue same.  3. Pure hypercholesterolemia Her LDL goal is <70, she is tolerating high-dose, high intensity statins without any side effects, continue the same.    4.  Hyperglycemia Patient's A1c has slightly increased.  I discussed this with the patient.  Probably related to statin effect as well, previously A1c was around 5.9-6.1.  Except for minimal coronary atherosclerosis, high-grade stenosis in a small branch D1 of LAD, she had no other significant coronary disease.  Her risk factors are well controlled and she remains asymptomatic.  I will see her back on a as needed basis.   Adrian Prows, MD, [facility] 10/07/2022, 10:07 AM Office: [phone]

## 2022-10-11 ENCOUNTER — Ambulatory Visit
Admission: RE | Admit: 2022-10-11 | Discharge: 2022-10-11 | Disposition: A | Discharge status: Home / Self Care | Payer: Medicare Other | Source: Ambulatory Visit | Attending: Physician Assistant | Admitting: Physician Assistant

## 2022-10-11 ENCOUNTER — Other Ambulatory Visit: Payer: Self-pay | Admitting: Physician Assistant

## 2022-10-11 DIAGNOSIS — Z1231 Encounter for screening mammogram for malignant neoplasm of breast: Secondary | ICD-10-CM

## 2022-12-30 DIAGNOSIS — H9313 Tinnitus, bilateral: Secondary | ICD-10-CM | POA: Diagnosis not present

## 2022-12-30 DIAGNOSIS — H8112 Benign paroxysmal vertigo, left ear: Secondary | ICD-10-CM | POA: Diagnosis not present

## 2023-02-12 ENCOUNTER — Other Ambulatory Visit: Payer: Self-pay | Admitting: Cardiology

## 2023-02-12 DIAGNOSIS — I10 Essential (primary) hypertension: Secondary | ICD-10-CM

## 2023-04-11 DIAGNOSIS — H35372 Puckering of macula, left eye: Secondary | ICD-10-CM | POA: Diagnosis not present

## 2023-04-11 DIAGNOSIS — H52203 Unspecified astigmatism, bilateral: Secondary | ICD-10-CM | POA: Diagnosis not present

## 2023-04-11 DIAGNOSIS — H40013 Open angle with borderline findings, low risk, bilateral: Secondary | ICD-10-CM | POA: Diagnosis not present

## 2023-04-11 DIAGNOSIS — Z961 Presence of intraocular lens: Secondary | ICD-10-CM | POA: Diagnosis not present

## 2023-04-11 DIAGNOSIS — H26493 Other secondary cataract, bilateral: Secondary | ICD-10-CM | POA: Diagnosis not present

## 2023-04-24 DIAGNOSIS — H26492 Other secondary cataract, left eye: Secondary | ICD-10-CM | POA: Diagnosis not present

## 2023-05-20 DIAGNOSIS — H9313 Tinnitus, bilateral: Secondary | ICD-10-CM | POA: Diagnosis not present

## 2023-05-20 DIAGNOSIS — H903 Sensorineural hearing loss, bilateral: Secondary | ICD-10-CM | POA: Diagnosis not present

## 2023-05-20 DIAGNOSIS — R42 Dizziness and giddiness: Secondary | ICD-10-CM | POA: Diagnosis not present

## 2023-09-03 NOTE — Progress Notes (Signed)
May be a candidate for the Rembrandt Trial with obicetrapib and ezetimibe.  She has ASCVD (NSTEMI), calculated LDL = 89, on a high dose statin.

## 2023-09-10 ENCOUNTER — Ambulatory Visit: Payer: Medicare Other | Attending: Cardiology | Admitting: Cardiology

## 2023-09-10 ENCOUNTER — Encounter: Payer: Self-pay | Admitting: Cardiology

## 2023-09-10 VITALS — BP 136/74 | HR 84 | Resp 16 | Ht 66.0 in | Wt 151.8 lb

## 2023-09-10 DIAGNOSIS — E7800 Pure hypercholesterolemia, unspecified: Secondary | ICD-10-CM

## 2023-09-10 DIAGNOSIS — R0609 Other forms of dyspnea: Secondary | ICD-10-CM

## 2023-09-10 DIAGNOSIS — R002 Palpitations: Secondary | ICD-10-CM | POA: Diagnosis not present

## 2023-09-10 DIAGNOSIS — I2510 Atherosclerotic heart disease of native coronary artery without angina pectoris: Secondary | ICD-10-CM | POA: Diagnosis not present

## 2023-09-10 DIAGNOSIS — I10 Essential (primary) hypertension: Secondary | ICD-10-CM | POA: Diagnosis not present

## 2023-09-10 NOTE — Progress Notes (Signed)
Cardiology Office Note:  .   Date:  09/10/2023  ID:  Carmen Fernandez, DOB 27-Sep-1946, MRN [id number] PCP: Delma Officer, PA  [facility] Providers Cardiologist:  Yates Decamp, MD    History of Present Illness: .   Carmen Fernandez is a 77 y.o.   Discussed the use of AI scribe software for clinical note transcription with the patient, who gave verbal consent to proceed.  History of Present Illness   Carmen Fernandez, a patient with a history of hypercholesterolemia, hyperglycemia, primary hypertension, coronary artery disease, and a history of encephaly, presents for an annual visit with a new onset of dyspnea. The patient is currently on aggressive medical therapy. 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 The patient reports feeling tired quickly and experiencing episodes where it feels like her heart stops for a second or two. These episodes can occur at any time, whether the patient is resting or active. The patient also reports experiencing headaches, which she attributes to anxiety. The patient's blood pressure at home usually ranges between 120 and 140, but has recently been dropping.     Review of Systems  Constitutional: Positive for malaise/fatigue.  Cardiovascular:  Positive for palpitations. Negative for chest pain, dyspnea on exertion and leg swelling.    Risk Assessment/Calculations:    Lab Results  Component Value Date   CHOL 144 07/01/2021   HDL 40 (L) 07/01/2021   LDLCALC 89 07/01/2021   TRIG 76 07/01/2021   CHOLHDL 3.6 07/01/2021   Lab Results  Component Value Date   NA 134 03/12/2022   K 5.0 03/12/2022   CO2 23 03/12/2022   GLUCOSE 96 03/12/2022   BUN 13 03/12/2022   CREATININE 0.73 03/12/2022   CALCIUM 9.5 03/12/2022   EGFR 85 03/12/2022   GFRNONAA >60 07/03/2021   Lab Results  Component Value Date   WBC 7.1 07/03/2021   HGB 11.8 (L) 07/03/2021   HCT 35.9 (L) 07/03/2021   MCV  91.3 07/03/2021   PLT 278 07/03/2021    Physical Exam:   VS:  BP 136/74 (BP Location: Left Arm, Patient Position: Sitting, Cuff Size: Normal)   Pulse 84   Resp 16   Ht 5\' 6"  (1.676 m)   Wt 151 lb 12.8 oz (68.9 kg)   SpO2 98%   BMI 24.50 kg/m    Wt Readings from Last 3 Encounters:  09/10/23 151 lb 12.8 oz (68.9 kg)  10/07/22 156 lb 6.4 oz (70.9 kg)  03/27/22 156 lb (70.8 kg)     Physical Exam Neck:     Vascular: No carotid bruit or JVD.  Cardiovascular:     Rate and Rhythm: Normal rate and regular rhythm.     Pulses: Intact distal pulses.     Heart sounds: Normal heart sounds. No murmur heard.    No gallop.  Pulmonary:     Effort: Pulmonary effort is normal.     Breath sounds: Normal breath sounds.  Abdominal:     General: Bowel sounds are normal.     Palpations: Abdomen is soft.  Musculoskeletal:     Right lower leg: No edema.     Left lower leg: No edema.     Studies Reviewed: Marland Kitchen    EKG:    EKG Interpretation Date/Time:  Wednesday September 10 2023 15:36:23 EDT Ventricular Rate:  72 PR Interval:  180 QRS Duration:  86 QT Interval:  380 QTC  Calculation: 416 R Axis:   -6  Text Interpretation: EKG 09/10/2023: Normal sinus rhythm with rate of 72 bpm, normal EKG.  Compared to 07/03/2021, no significant change. Confirmed by Carmen Fernandez [phone]) on 09/10/2023 3:58:26 PM    Coronary angiogram 07/02/2021: Normal coronary arteries except D1 with a 80% ostial stenosis.  Normal LVEF and EDP.     Echocardiogram 07/01/2021: Normal LV systolic function, EF 55 to 60% without wall motion abnormality, grade 1 diastolic dysfunction.   Mild aortic valve sclerosis.  No other significant abnormality.  ASSESSMENT AND PLAN: .      ICD-10-CM   1. Coronary artery disease involving native coronary artery of native heart without angina pectoris  I25.10 EKG 12-Lead    2. Dyspnea on exertion  R06.09     3. Palpitations  R00.2     4. Primary hypertension  I10     5. Pure  hypercholesterolemia  E78.00       Assessment and Plan    New Onset Dyspnea Likely secondary to caregiver fatigue due to caring for mother and sister. No evidence of cardiac etiology based on normal EKG and physical exam. -Encouraged patient to take time for self-care and rest.  Hyperlipidemia Despite high dose Lipitor (80mg ), LDL remains at 89, above the target of <70 due to history of heart attack. -Consider adding Zetia to further lower LDL. -Discussed potential participation in Rembrandt trial, which involves an injectable medication in addition to Zetia. Patient to discuss with research team and decide.  Hypertension Blood pressure controlled, usually in 120s-130s. -Continue current management.  Coronary Artery Disease Stable on current regimen, no new symptoms. -Continue current management.  Follow-up -Annual visit in one year, or sooner if needed. -Reminder letter to be sent in May for follow-up appointment scheduling.      May be a candidate for the Rembrandt Trial with obicetrapib and ezetimibe.  She has ASCVD (NSTEMI), calculated LDL = 89, on a high dose statin.   Signed,  Yates Decamp, MD, [facility] 09/10/2023, 8:56 PM [facility] [address] [place], [place] [address] Phone: [phone]. Fax:  [phone]

## 2023-09-10 NOTE — Patient Instructions (Signed)
Medication Instructions:  Your physician recommends that you continue on your current medications as directed. Please refer to the Current Medication list given to you today.  *If you need a refill on your cardiac medications before your next appointment, please call your pharmacy*  Lab Work: None ordered today.  Testing/Procedures: None ordered today.  Follow-Up: At [facility], you and your health needs are our priority.  As part of our continuing mission to provide you with exceptional heart care, we have created designated Provider Care Teams.  These Care Teams include your primary Cardiologist (physician) and Advanced Practice Providers (APPs -  Physician Assistants and Nurse Practitioners) who all work together to provide you with the care you need, when you need it.  We recommend signing up for the patient portal called "MyChart".  Sign up information is provided on this After Visit Summary.  MyChart is used to connect with patients for Virtual Visits (Telemedicine).  Patients are able to view lab/test results, encounter notes, upcoming appointments, etc.  Non-urgent messages can be sent to your provider as well.   To learn more about what you can do with MyChart, go to [email or web address].    Your next appointment:   1 year(s)  The format for your next appointment:   In Person  Provider:   Yates Decamp, MD

## 2023-11-17 ENCOUNTER — Other Ambulatory Visit: Payer: Self-pay | Admitting: Cardiology

## 2023-11-17 DIAGNOSIS — I10 Essential (primary) hypertension: Secondary | ICD-10-CM

## 2024-01-01 DIAGNOSIS — Z23 Encounter for immunization: Secondary | ICD-10-CM | POA: Diagnosis not present

## 2024-01-01 DIAGNOSIS — R7301 Impaired fasting glucose: Secondary | ICD-10-CM | POA: Diagnosis not present

## 2024-01-01 DIAGNOSIS — Z0000 Encounter for general adult medical examination without abnormal findings: Secondary | ICD-10-CM | POA: Diagnosis not present

## 2024-01-01 DIAGNOSIS — I2510 Atherosclerotic heart disease of native coronary artery without angina pectoris: Secondary | ICD-10-CM | POA: Diagnosis not present

## 2024-01-01 DIAGNOSIS — E7800 Pure hypercholesterolemia, unspecified: Secondary | ICD-10-CM | POA: Diagnosis not present

## 2024-01-01 DIAGNOSIS — I10 Essential (primary) hypertension: Secondary | ICD-10-CM | POA: Diagnosis not present

## 2024-01-01 DIAGNOSIS — M8580 Other specified disorders of bone density and structure, unspecified site: Secondary | ICD-10-CM | POA: Diagnosis not present

## 2024-01-01 DIAGNOSIS — R109 Unspecified abdominal pain: Secondary | ICD-10-CM | POA: Diagnosis not present

## 2024-01-13 DIAGNOSIS — Z1211 Encounter for screening for malignant neoplasm of colon: Secondary | ICD-10-CM | POA: Diagnosis not present

## 2024-01-13 DIAGNOSIS — Z1212 Encounter for screening for malignant neoplasm of rectum: Secondary | ICD-10-CM | POA: Diagnosis not present

## 2024-06-28 DIAGNOSIS — I10 Essential (primary) hypertension: Secondary | ICD-10-CM | POA: Diagnosis not present

## 2024-06-28 DIAGNOSIS — R7301 Impaired fasting glucose: Secondary | ICD-10-CM | POA: Diagnosis not present

## 2024-06-28 DIAGNOSIS — E7800 Pure hypercholesterolemia, unspecified: Secondary | ICD-10-CM | POA: Diagnosis not present

## 2024-08-04 DIAGNOSIS — Z961 Presence of intraocular lens: Secondary | ICD-10-CM | POA: Diagnosis not present

## 2024-08-04 DIAGNOSIS — H35372 Puckering of macula, left eye: Secondary | ICD-10-CM | POA: Diagnosis not present

## 2024-08-04 DIAGNOSIS — H40013 Open angle with borderline findings, low risk, bilateral: Secondary | ICD-10-CM | POA: Diagnosis not present

## 2024-08-04 DIAGNOSIS — H26491 Other secondary cataract, right eye: Secondary | ICD-10-CM | POA: Diagnosis not present
